# Patient Record
Sex: Male | Born: 1955 | Race: Black or African American | Hispanic: No | Marital: Married | Smoking: Former smoker
Health system: Southern US, Community
[De-identification: ages and names within clinical notes are randomized; demographics above are authoritative.]

## PROBLEM LIST (undated history)

## (undated) DIAGNOSIS — K5790 Diverticulosis of intestine, part unspecified, without perforation or abscess without bleeding: Secondary | ICD-10-CM

## (undated) DIAGNOSIS — I10 Essential (primary) hypertension: Secondary | ICD-10-CM

## (undated) DIAGNOSIS — M1711 Unilateral primary osteoarthritis, right knee: Secondary | ICD-10-CM

## (undated) DIAGNOSIS — F419 Anxiety disorder, unspecified: Secondary | ICD-10-CM

## (undated) DIAGNOSIS — K219 Gastro-esophageal reflux disease without esophagitis: Secondary | ICD-10-CM

## (undated) DIAGNOSIS — K922 Gastrointestinal hemorrhage, unspecified: Secondary | ICD-10-CM

## (undated) DIAGNOSIS — M1990 Unspecified osteoarthritis, unspecified site: Secondary | ICD-10-CM

## (undated) HISTORY — PX: OLECRANON BURSA EXCISION: SUR541

---

## 2005-08-24 ENCOUNTER — Ambulatory Visit: Payer: Self-pay | Admitting: Internal Medicine

## 2005-09-23 ENCOUNTER — Ambulatory Visit: Payer: Self-pay | Admitting: Specialist

## 2006-02-20 ENCOUNTER — Ambulatory Visit: Payer: Self-pay | Admitting: General Surgery

## 2006-05-05 ENCOUNTER — Ambulatory Visit: Payer: Self-pay | Admitting: General Surgery

## 2006-09-29 ENCOUNTER — Ambulatory Visit: Payer: Self-pay | Admitting: Unknown Physician Specialty

## 2006-11-27 ENCOUNTER — Ambulatory Visit: Payer: Self-pay | Admitting: General Surgery

## 2006-12-01 ENCOUNTER — Ambulatory Visit: Payer: Self-pay | Admitting: General Surgery

## 2012-08-02 ENCOUNTER — Ambulatory Visit: Payer: Self-pay | Admitting: Specialist

## 2012-08-14 ENCOUNTER — Ambulatory Visit: Payer: Self-pay | Admitting: Specialist

## 2012-08-15 LAB — PATHOLOGY REPORT

## 2014-02-14 LAB — COMPREHENSIVE METABOLIC PANEL
ANION GAP: 8 (ref 7–16)
Albumin: 3.7 g/dL (ref 3.4–5.0)
Alkaline Phosphatase: 66 U/L
BUN: 27 mg/dL — AB (ref 7–18)
Bilirubin,Total: 0.5 mg/dL (ref 0.2–1.0)
CO2: 23 mmol/L (ref 21–32)
Calcium, Total: 9.2 mg/dL (ref 8.5–10.1)
Chloride: 110 mmol/L — ABNORMAL HIGH (ref 98–107)
Creatinine: 1.44 mg/dL — ABNORMAL HIGH (ref 0.60–1.30)
EGFR (African American): >60
GFR CALC NON AF AMER: 54 — AB
Glucose: 136 mg/dL — ABNORMAL HIGH (ref 65–99)
OSMOLALITY: 288 (ref 275–301)
Potassium: 4.1 mmol/L (ref 3.5–5.1)
SGOT(AST): 18 U/L (ref 15–37)
SGPT (ALT): 25 U/L
SODIUM: 141 mmol/L (ref 136–145)
Total Protein: 7.4 g/dL (ref 6.4–8.2)

## 2014-02-14 LAB — CBC
HCT: 41.2 % (ref 40.0–52.0)
HGB: 13.9 g/dL (ref 13.0–18.0)
MCH: 30.1 pg (ref 26.0–34.0)
MCHC: 33.8 g/dL (ref 32.0–36.0)
MCV: 89 fL (ref 80–100)
PLATELETS: 178 10*3/uL (ref 150–440)
RBC: 4.61 10*6/uL (ref 4.40–5.90)
RDW: 13.4 % (ref 11.5–14.5)
WBC: 6.9 10*3/uL (ref 3.8–10.6)

## 2014-02-15 ENCOUNTER — Inpatient Hospital Stay: Payer: Self-pay | Admitting: Internal Medicine

## 2014-02-15 LAB — HEMOGLOBIN
HGB: 11.1 g/dL — ABNORMAL LOW (ref 13.0–18.0)
HGB: 11.2 g/dL — ABNORMAL LOW (ref 13.0–18.0)

## 2014-02-15 LAB — PROTIME-INR
INR: 1
Prothrombin Time: 12.8 secs (ref 11.5–14.7)

## 2014-02-16 LAB — BASIC METABOLIC PANEL
ANION GAP: 5 — AB (ref 7–16)
BUN: 12 mg/dL (ref 7–18)
CHLORIDE: 109 mmol/L — AB (ref 98–107)
CREATININE: 1.25 mg/dL (ref 0.60–1.30)
Calcium, Total: 8.8 mg/dL (ref 8.5–10.1)
Co2: 25 mmol/L (ref 21–32)
EGFR (African American): >60
EGFR (Non-African Amer.): >60
Glucose: 101 mg/dL — ABNORMAL HIGH (ref 65–99)
Osmolality: 277 (ref 275–301)
Potassium: 3.6 mmol/L (ref 3.5–5.1)
SODIUM: 139 mmol/L (ref 136–145)

## 2014-02-16 LAB — CBC WITH DIFFERENTIAL/PLATELET
Basophil #: 0 10*3/uL (ref 0.0–0.1)
Basophil %: 0.4 %
Eosinophil #: 0.2 10*3/uL (ref 0.0–0.7)
Eosinophil %: 3.2 %
HCT: 34.8 % — AB (ref 40.0–52.0)
HGB: 11.4 g/dL — ABNORMAL LOW (ref 13.0–18.0)
Lymphocyte #: 1 10*3/uL (ref 1.0–3.6)
Lymphocyte %: 19.4 %
MCH: 29.4 pg (ref 26.0–34.0)
MCHC: 32.8 g/dL (ref 32.0–36.0)
MCV: 90 fL (ref 80–100)
Monocyte #: 0.8 x10 3/mm (ref 0.2–1.0)
Monocyte %: 14.2 %
NEUTROS ABS: 3.3 10*3/uL (ref 1.4–6.5)
Neutrophil %: 62.8 %
Platelet: 148 10*3/uL — ABNORMAL LOW (ref 150–440)
RBC: 3.87 10*6/uL — AB (ref 4.40–5.90)
RDW: 13.2 % (ref 11.5–14.5)
WBC: 5.3 10*3/uL (ref 3.8–10.6)

## 2014-02-16 LAB — MAGNESIUM: Magnesium: 1.6 mg/dL — ABNORMAL LOW

## 2014-07-12 ENCOUNTER — Ambulatory Visit: Payer: Self-pay | Admitting: Specialist

## 2014-09-26 NOTE — Op Note (Signed)
PATIENT NAME:  Darrell BignessGARRISON, Darrell Freeman MR#:  [id number] DATE OF BIRTH:  August 23, 1955  DATE OF PROCEDURE:  08/14/2012   PREOPERATIVE DIAGNOSIS: Severe olecranon bursitis, right elbow, with gout.   POSTOPERATIVE DIAGNOSIS: Severe olecranon bursitis, right elbow, with gout.   PROCEDURE: Excision of olecranon bursa, right elbow.   SURGEON: Myra Rudehristopher Smith, M.D.   ANESTHESIA: General.   COMPLICATIONS: None.   TOURNIQUET TIME: Approximately 60 minutes.   DRAIN: One large vessel loop.   DESCRIPTION OF PROCEDURE: One gram of Ancef was given intravenously prior to the procedure. General anesthesia is induced. The right upper extremity is thoroughly prepped with alcohol and ChloraPrep and draped in standard sterile fashion. The extremity is wrapped out with the Esmarch bandage and pneumatic tourniquet elevated to 250 mmHg. A lateral incision is made at the base of the prominence of the bursa and the dissection carefully carried down under loupe magnification to the bursa sac. The bursa sac is then carefully removed under loupe magnification. There is seen to be a moderate amount of gouty crystals present within the sac, indicative of systemic gout. Rongeur is used to clean out any extra remaining pieces of bursal tissue. The wound is thoroughly irrigated multiple times. Skin edges are infiltrated with 0.5% plain Marcaine. The subcutaneous tissue is closed with 3-0 Vicryl, and the skin is closed with the skin stapler. A soft bulky dressing is applied. The patient is returned to the recovery room having tolerated the procedure quite well.   ____________________________ Clare Gandyhristopher E. Smith, MD ces:lo D: 08/14/2012 11:24:39 ET T: 08/14/2012 12:27:18 ET JOB#: [id number]  cc: Clare Gandyhristopher E. Smith, MD, <Dictator> Clare GandyHRISTOPHER E SMITH MD ELECTRONICALLY SIGNED 08/18/2012 13:36

## 2014-09-27 NOTE — Consult Note (Signed)
Chief Complaint:  Subjective/Chief Complaint Cross cover for Dr. Candace Cruise. Patient with last blood in stool was yesterday morning. Says he is feeling well. Also reports that he feels like he may have to have a bowel movement soon. No pain. Colonoscopy last July with tics.   VITAL SIGNS/ANCILLARY NOTES: **Vital Signs.:   13-Sep-15 07:14  Vital Signs Type Q 8hr  Temperature Temperature (F) 97.4  Celsius 36.3  Temperature Source oral  Pulse Pulse 71  Respirations Respirations 17  Systolic BP Systolic BP 340  Diastolic BP (mmHg) Diastolic BP (mmHg) 86  Mean BP 109  Pulse Ox % Pulse Ox % 100  Pulse Ox Activity Level  With exertion  Oxygen Delivery Room Air/ 21 %   Brief Assessment:  GEN well developed, well nourished, no acute distress   Cardiac Regular   Respiratory normal resp effort  no use of accessory muscles   Additional Physical Exam Alert and orientated times 3   Lab Results: Routine Chem:  13-Sep-15 05:22   Glucose, Serum  101  BUN 12  Creatinine (comp) 1.25  Sodium, Serum 139  Potassium, Serum 3.6  Chloride, Serum  109  CO2, Serum 25  Calcium (Total), Serum 8.8  Anion Gap  5  Osmolality (calc) 277  eGFR (African American) >60  eGFR (Non-African American) >60 (eGFR values <75m/min/1.73 m2 may be an indication of chronic kidney disease (CKD). Calculated eGFR is useful in patients with stable renal function. The eGFR calculation will not be reliable in acutely ill patients when serum creatinine is changing rapidly. It is not useful in  patients on dialysis. The eGFR calculation may not be applicable to patients at the low and high extremes of body sizes, pregnant women, and vegetarians.)  Magnesium, Serum  1.6 (1.8-2.4 THERAPEUTIC RANGE: 4-7 mg/dL TOXIC: > 10 mg/dL  -----------------------)  Routine Hem:  13-Sep-15 05:22   WBC (CBC) 5.3  RBC (CBC)  3.87  Hemoglobin (CBC)  11.4  Hematocrit (CBC)  34.8  Platelet Count (CBC)  148  MCV 90  MCH 29.4  MCHC 32.8   RDW 13.2  Neutrophil % 62.8  Lymphocyte % 19.4  Monocyte % 14.2  Eosinophil % 3.2  Basophil % 0.4  Neutrophil # 3.3  Lymphocyte # 1.0  Monocyte # 0.8  Eosinophil # 0.2  Basophil # 0.0 (Result(s) reported on 16 Feb 2014 at 05:41AM.)   Assessment/Plan:  Assessment/Plan:  Assessment Lower Gi bleed.   Plan The patient has been doing well without any bleeding since yesterday morning. If no further bleeding have patient follow up as an outpatient [facility].   Electronic Signatures: WLucilla Lame(MD)  (Signed 13-Sep-15 10:42)  Authored: Chief Complaint, VITAL SIGNS/ANCILLARY NOTES, Brief Assessment, Lab Results, Assessment/Plan   Last Updated: 13-Sep-15 10:42 by WLucilla Lame(MD)

## 2014-09-27 NOTE — Consult Note (Signed)
Pt seen and examined. Full consult to follow. Known hx of polyps. Had a small polyp removed by Dr. Mechele CollinElliott 2 months ago at [facility]. Had been constipated recently. Was taking some hard raisins 2 nights ago. Also, admitted to taking advil multiple times over 3 days or so earlier this week for arthritic knee pain. Yesterday afternoon, started passing BRBPR without any abdominal pain. Came to ER last evening. Had about 6 episodes of rectal bleeding last night. None so far this AM. Otherwise, no other sxs. No CP/SOB/ weakeness/lightheadedness.  CT showed diffuse tics, though more prominent in descending/sigmoid area. Pt likely has diverticular bleed, which hopefully will resolve on own off NSAIDS. If bleeding recurs as diet is advanced, then order bleeding scan to localize site of bleeding. I will be out tomorrow. Dr. Servando SnareWohl will cover for me tomorrow. Thanks.  Electronic Signatures: Lutricia Feilh, Adil Tugwell (MD)  (Signed on 12-Sep-15 10:20)  Authored  Last Updated: 12-Sep-15 10:20 by Lutricia Feilh, Proctor Carriker (MD)

## 2014-09-27 NOTE — Consult Note (Signed)
PATIENT NAME:  Darrell Freeman, Darrell Freeman MR#:  [id number] DATE OF BIRTH:  [birth year]  DATE OF CONSULTATION:  02/15/2014  CONSULTING PHYSICIAN:  Ezzard StandingPaul Y. Bluford Kaufmannh, MD   REASON FOR REFERRAL: Gross hematochezia.   DESCRIPTION: The patient is a 59 year old white male with a known history of diverticulosis and hypertension who presents with bright red blood per rectum that started several hours prior to his visit to the Emergency Room.  Before the hospital visit, he had at least 3 bloody stools. Then in the Emergency Room, he had another bout of hematochezia.  He had a colonoscopy by Dr. Mechele CollinElliott 2 months ago for a history of polyps.  He had a single small polyp that was benign. [facility]. Therefore, I do not have the results to review.   The patient admitted to being constipated recently.  He was taking some hard raisins 2 nights ago.  He also admitted to taking some Advil multiple times over 3 days or so earlier this week because of his arthritic knee pain.  It was yesterday afternoon when he started passing bright blood.  He had no abdominal pain or cramping.  He had a total of 6 episodes by the time I saw him.  Fortunately, he denied having any chest pain or shortness of breath or coughing or palpitations.  He did not feel weak or lightheaded.  He did have a CT scan of the abdomen that showed diffuse diverticula.  It was more prominent in the descending and sigmoid colon area.     PAST MEDICAL HISTORY: Notable for diverticulitis, hypertension.   PAST SURGICAL HISTORY:  He had hernia surgery.   FAMILY HISTORY: Notable for rectal cancer.   HOME MEDICATIONS:  He has home medications for hypertension and gout, but he could not remember the names.   REVIEW OF SYSTEMS: Please refer to the review of symptoms that was dictated by the admitting doctor.   PHYSICAL EXAMINATION:  GENERAL: The patient looks comfortable in no acute distress.  VITAL SIGNS: He is afebrile. Vital signs are stable.   HEENT: Normocephalic, atraumatic head.  Pupils are equally reactive. Throat was clear.  NECK: Supple.  HEART:  Regular rhythm and rate.  LUNGS: Clear bilaterally.  ABDOMEN: Normoactive bowel sounds, soft, nontender. There is no hepatomegaly.  He has active bowel sounds.  EXTREMITIES: No clubbing, cyanosis, or edema.   RADIOLOGICAL DATA AND LABORATORY DATA:  This morning hemoglobin was 11.1, and was 13.9 on admission, creatinine 1.44, chloride 110, BUN is 27, creatinine 1.44.  Liver enzymes were normal.  INR was normal.   ASSESSMENT AND PLAN: This is a patient with known history of diverticulosis. He does have a history of polyps.  It has been 2 months since his colonoscopy.  Therefore, it will be highly unlikely the bleeding came from the polypectomy.  It is likely with his extensive diverticulitis that he bled from the diverticulosis.  It may be exacerbated by recent non-steroidal antiinflammatory drugs use.  I suspect the bleeding will stop on its own of the non-steroidal antiinflammatory drugs.  However, if the bleeding recurs, we can order a bleeding scan to localize the site of bleeding.  We will start him on a clear liquid diet and then gradually advance as tolerated.   I will have Dr. Servando SnareWohl see the patient tomorrow in my absence.   Thank you for the referral.     ____________________________ Ezzard StandingPaul Y. Bluford Kaufmannh, MD pyo:DT D: 02/17/2014 17:05:00 ET T: 02/17/2014 17:35:17 ET  JOB#: [id number]  cc: Ezzard Standing. Bluford Kaufmann, MD, <Dictator> Ezzard Standing Lyndia Bury MD ELECTRONICALLY SIGNED 02/18/2014 9:32

## 2014-09-27 NOTE — Discharge Summary (Signed)
PATIENT NAME:  Darrell Freeman, Darrell Freeman MR#:  [id number] DATE OF BIRTH:  Jan 24, 1956  DATE OF ADMISSION:  02/15/2014 DATE OF DISCHARGE:  02/16/2014  For a detailed note please look at the history and physical done on admission by Dr. Randol KernElgergawy.    DIAGNOSES AT DISCHARGE:  Gastrointestinal bleed, likely diverticular in nature, hypertension. Acute renal failure.   DISCHARGE INSTRUCTIONS: The patient is being discharged on a low-sodium diet. Activity is as tolerated. Follow up with Dr. Lutricia FeilPaul Oh and Dr. Clydie Braunavid Fitzgerald in the next 1-2 weeks.   DISCHARGE MEDICATIONS:  Losartan 50 mg daily, lisinopril 10 mg daily, prednisone taper, Robitussin as needed, Flonase 2 sprays to each nostril daily as needed.   CONSULTANTS DURING THE HOSPITAL COURSE: Dr. Midge Miniumarren Wohl and Dr. Lutricia FeilPaul Oh from gastroenterology.   PERTINENT STUDIES DONE DURING THE HOSPITAL COURSE:  CT scan of the abdomen and pelvis done with contrast showing scattered diverticulosis along the entirety of the colon, most prominent in descending and proximal sigmoid colon without evidence of diverticulitis.   HOSPITAL COURSE: This is a 59 year old male with medical problems as mentioned above, who presented to the hospital with multiple episodes of bright red blood per rectum.   1. GI bleed. This was likely a lower GI bleed suspected to be diverticular in nature given the CT scan findings. The patient apparently was taking increasing doses of ibuprofen at home for some joint pains. The patient was observed being off the nonsteroidal anti-inflammatory drugs and his bleeding had improved. He did continue to have some maroon-colored stool which was old blood, but no evidence of acute bright red blood. The patient was seen by GI,  they thought this was a diverticular bleed and  it would be self-limiting. They do not plan on doing any endoscopic evaluation at this point. He was strongly advised to avoid nonsteroidal anti-inflammatory drugs. The patient's diet was  slowly advanced from a liquid eventually to a  low residue diet which he tolerated without evidence of any acute bleeding and his hemoglobin remained stable. The patient therefore is currently being discharged with close followup with GI as an outpatient.  2. Hypertension. The patient was somewhat hypotensive when he presented, therefore his antihypertensives were held, although he can resume his losartan and lisinopril upon discharge.  3. Acute renal failure. This was likely prerenal azotemia from the acute volume loss from the GI bleed. The patient was hydrated with IV fluids and his BUN creatinine have now come back to baseline.   CODE STATUS: The patient is a full code.   DISPOSITION: He is being discharged home.   TIME SPENT ON DISCHARGE: 40 minutes    ____________________________ Rolly PancakeVivek J. Cherlynn KaiserSainani, MD vjs:bu D: 02/17/2014 15:37:52 ET T: 02/17/2014 15:54:30 ET JOB#: [id number]  cc: Rolly PancakeVivek J. Cherlynn KaiserSainani, MD, <Dictator> Ezzard StandingPaul Y. Bluford Kaufmannh, MD Stann Mainlandavid P. Sampson GoonFitzgerald, MD Houston SirenVIVEK J Giovanne Nickolson MD ELECTRONICALLY SIGNED 02/24/2014 9:42

## 2014-09-27 NOTE — H&P (Signed)
PATIENT NAME:  Darrell Freeman, Darrell Freeman MR#:  [id number] DATE OF BIRTH:  06-26-1955  DATE OF ADMISSION:  02/15/2014  REFERRING PHYSICIAN: Northlake Sink. Dolores Frame, MD  PRIMARY CARE PHYSICIAN: Stann Mainland. Sampson Goon, MD  CHIEF COMPLAINT: Bright red blood per rectum.   HISTORY OF PRESENT ILLNESS: This is a 59 year old male with known history of hypertension, diverticulitis and diverticulosis, who presents with complaints of bright red blood per rectum that started a few hours ago. He reports so far he had 3 large bowel movements. As well, the patient had 1 more episode in the ED where he was noticed to have hematochezia. The patient reports he had recent colonoscopy by Dr. Mechele Collin 2 months ago, where he was found to have a polyp which was benign; besides that, he reports no other abnormalities. The patient's hemoglobin was stable at 13.9. He denies any NSAID use, any coffee-ground emesis, any melena, denies any history of gastric ulcer or previously bleed in the past. The patient had CT abdomen and pelvis done, which did show evidence of diverticulosis. The hospitalist service requested to admit the patient for further evaluation.   He denies any chest pain, any shortness of breath, fever, chills, cough, productive sputum. Reports he had recent upper respiratory symptoms, where he was prescribed p.o. prednisone.    PAST MEDICAL HISTORY:  1.  Hypertension.  2.  Diverticulosis.  3.  Gout.   PAST SURGICAL HISTORY:  1.  Colonoscopy.  2.  Hernia surgery.   SOCIAL HISTORY: The patient works at [employer]. No smoking. No alcohol. No illicit drug use.   FAMILY HISTORY: Reports family history of rectal cancer.   ALLERGIES: No known drug allergies.   HOME MEDICATIONS: The patient reports he is taking gout medication as needed; does not remember the name. As well, reports he is antihypertensive medication, which he cannot recall the name. He reports he was recently started on tapering dose of prednisone for upper respiratory  symptoms.   REVIEW OF SYSTEMS:  CONSTITUTIONAL: Denies fever, chills, fatigue, weakness.  EYES: Denies blurry vision, double vision, inflammation.  ENT: Denies tinnitus, ear pain, hearing loss, epistaxis.  RESPIRATORY: Denies cough, wheezing, shortness of breath or COPD.  CARDIOVASCULAR: Denies chest pain, edema, palpitation, syncope.  GASTROINTESTINAL: Denies nausea, vomiting, diarrhea, abdominal pain, hematemesis, melena. Reports bright red blood per rectum.  GENITOURINARY: Denies dysuria, hematuria, renal colic.  ENDOCRINE: Denies polyuria, polydipsia, heat or cold intolerance. HEMATOLOGIC: Denies anemia, easy bruising, bleeding diathesis.  INTEGUMENTARY: Denies acne, rash, or skin lesion.  MUSCULOSKELETAL: Denies any swelling, arthritis, cramps.  NEUROLOGIC: Denies CVA, TIA, tremors, vertigo, ataxia.  PSYCHIATRIC: Denies anxiety, insomnia, or depression.   PHYSICAL EXAMINATION:  VITAL SIGNS: Temperature 98, pulse 71, respiratory rate 18, blood pressure 135/92, saturating 100% on room air.  GENERAL: Well-nourished male who looks comfortable in bed, in no apparent distress.  HEENT: Head atraumatic, normocephalic.  Pupils equal and reactive to light. Pink conjunctivae. Anicteric sclerae. Moist oral mucosa.  NECK: Supple. No thyromegaly. No JVD.  CHEST: Good air entry bilaterally. No wheezing, rales or rhonchi. No use of accessory muscles.  CARDIOVASCULAR: S1, S2 heard. No rubs, murmur or gallops. PMI nondisplaced.  ABDOMEN: Soft, nontender, nondistended. Bowel sounds present. No rebound. No guarding.  EXTREMITIES: No edema. No clubbing. No cyanosis. Pedal pulses +2 bilaterally.  PSYCHIATRIC: Appropriate affect x 3.  NEUROLOGIC: Cranial nerves grossly intact. Motor 5/5. Sensation symmetrical and intact to light touch.  MUSCULOSKELETAL: No joint effusion or erythema.  SKIN: Normal skin turgor. Warm and dry.  PERTINENT LABORATORY DATA: Glucose 136, BUN 27, creatinine 1.44, sodium 141,  potassium 4.1, chloride 110. White blood cells 6.9, hemoglobin 13.9, hematocrit 41.2, platelets 178,000.   ASSESSMENT AND PLAN:  1.  Hematochezia/bright red blood per rectum. This is most likely lower GI bleed from diverticulosis. The patient will be admitted for further evaluation. We will consult t gastroenterology service. We will keep him on a clear liquid diet .We will check hemoglobin every 8 hours and transfuse as needed.  2.  Hypertension. Blood pressure is acceptable. We will resume back on his home medication when it is known to us. 3.  Deep vein thrombosis prophylaxis. Sequential compression device and TED hose. No chemical anticoagulation secondary to his bleed.   TOTAL TIME SPENT ON ADMISSION AND PATIENT CARE: 50 minutes    ____________________________ Darrell Armsawood S. Elgergawy, MD dse:MT D: 02/15/2014 05:15:06 ET T: 02/15/2014 06:47:33 ET JOB#: [id number]  cc: Darrell Armsawood S. Elgergawy, MD, <Dictator>  DAWOOD Teena IraniS ELGERGAWY MD ELECTRONICALLY SIGNED 02/15/2014 23:50

## 2014-12-12 ENCOUNTER — Other Ambulatory Visit: Payer: Self-pay

## 2014-12-12 ENCOUNTER — Encounter: Payer: Self-pay | Admitting: *Deleted

## 2014-12-12 NOTE — Patient Instructions (Signed)
  Your procedure is scheduled on: 12-26-14 Report to [facility] 2ND FLOOR To find out your arrival time please call [phone] between 1PM - 3PM on 12-25-14  Remember: Instructions that are not followed completely may result in serious medical risk, up to and including death, or upon the discretion of your surgeon and anesthesiologist your surgery may need to be rescheduled.    _X___ 1. Do not eat food or drink liquids after midnight. No gum chewing or hard candies.     _X___ 2. No Alcohol for 24 hours before or after surgery.   ____ 3. Bring all medications with you on the day of surgery if instructed.    ____ 4. Notify your doctor if there is any change in your medical condition     (cold, fever, infections).     Do not wear jewelry, make-up, hairpins, clips or nail polish.  Do not wear lotions, powders, or perfumes. You may wear deodorant.  Do not shave 48 hours prior to surgery. Men may shave face and neck.  Do not bring valuables to the hospital.    [facility] any belongings or valuables.               Contacts, dentures or bridgework may not be worn into surgery.  Leave your suitcase in the car. After surgery it may be brought to your room.  For patients admitted to the hospital, discharge time is determined by your  treatment team.   Patients discharged the day of surgery will not be allowed to drive home.   Please read over the following fact sheets that you were given:     __X__ Take these medicines the morning of surgery with A SIP OF WATER:    1. LOSARTAN  2. ZANTAC  3. TAKE A ZANTAC Thursday NIGHT  4.  5.  6.  ____ Fleet Enema (as directed)   ____ Use CHG Soap as directed  ____ Use inhalers on the day of surgery  ____ Stop metformin 2 days prior to surgery    ____ Take 1/2 of usual insulin dose the night before surgery and none on the morning of surgery.   ____ Stop Coumadin/Plavix/aspirin-N/A  __X__ Stop  Anti-inflammatories-STOP ADVIL 7 DAYS PRIOR-NO NSAIDS OR ASA PRODUCTS-TYLENOL OK   ____ Stop supplements until after surgery.    ____ Bring C-Pap to the hospital.

## 2014-12-26 ENCOUNTER — Ambulatory Visit
Admission: RE | Admit: 2014-12-26 | Discharge: 2014-12-26 | Disposition: A | Discharge status: Home / Self Care | Payer: 59 | Source: Ambulatory Visit | Attending: Specialist | Admitting: Specialist

## 2014-12-26 ENCOUNTER — Ambulatory Visit: Payer: 59 | Admitting: Anesthesiology

## 2014-12-26 ENCOUNTER — Encounter
Admission: RE | Disposition: A | Discharge status: Home / Self Care | Payer: Self-pay | Source: Ambulatory Visit | Attending: Specialist

## 2014-12-26 ENCOUNTER — Encounter: Payer: Self-pay | Admitting: *Deleted

## 2014-12-26 DIAGNOSIS — M1990 Unspecified osteoarthritis, unspecified site: Secondary | ICD-10-CM | POA: Insufficient documentation

## 2014-12-26 DIAGNOSIS — M1A0220 Idiopathic chronic gout, left elbow, without tophus (tophi): Secondary | ICD-10-CM | POA: Diagnosis not present

## 2014-12-26 DIAGNOSIS — K219 Gastro-esophageal reflux disease without esophagitis: Secondary | ICD-10-CM | POA: Diagnosis not present

## 2014-12-26 DIAGNOSIS — I10 Essential (primary) hypertension: Secondary | ICD-10-CM | POA: Diagnosis not present

## 2014-12-26 DIAGNOSIS — Z87891 Personal history of nicotine dependence: Secondary | ICD-10-CM | POA: Insufficient documentation

## 2014-12-26 DIAGNOSIS — M7032 Other bursitis of elbow, left elbow: Secondary | ICD-10-CM | POA: Diagnosis present

## 2014-12-26 HISTORY — DX: Unspecified osteoarthritis, unspecified site: M19.90

## 2014-12-26 HISTORY — DX: Essential (primary) hypertension: I10

## 2014-12-26 HISTORY — PX: OLECRANON BURSECTOMY: SHX2097

## 2014-12-26 HISTORY — DX: Gastro-esophageal reflux disease without esophagitis: K21.9

## 2014-12-26 SURGERY — BURSECTOMY, ELBOW
Anesthesia: General | Laterality: Left

## 2014-12-26 MED ORDER — OXYCODONE HCL 5 MG PO TABS
ORAL_TABLET | ORAL | Status: DC
Start: 2014-12-26 — End: 2014-12-26
  Filled 2014-12-26: qty 1

## 2014-12-26 MED ORDER — LACTATED RINGERS IV SOLN
INTRAVENOUS | Status: DC | PRN
  Administered 2014-12-26: 07:00:00 via INTRAVENOUS

## 2014-12-26 MED ORDER — FENTANYL CITRATE (PF) 100 MCG/2ML IJ SOLN
INTRAMUSCULAR | Status: DC | PRN
  Administered 2014-12-26: 50 ug via INTRAVENOUS

## 2014-12-26 MED ORDER — LIDOCAINE HCL (PF) 1 % IJ SOLN
INTRAMUSCULAR | Status: AC
  Filled 2014-12-26: qty 30

## 2014-12-26 MED ORDER — HYDROCODONE-ACETAMINOPHEN 5-325 MG PO TABS
ORAL_TABLET | ORAL | Status: AC
  Administered 2014-12-26: 1 via ORAL
  Filled 2014-12-26: qty 1

## 2014-12-26 MED ORDER — PROPOFOL 10 MG/ML IV BOLUS
INTRAVENOUS | Status: DC | PRN
  Administered 2014-12-26: 200 mg via INTRAVENOUS

## 2014-12-26 MED ORDER — CEFAZOLIN SODIUM-DEXTROSE 2-3 GM-% IV SOLR
INTRAVENOUS | Status: DC | PRN
  Administered 2014-12-26: 2 g via INTRAVENOUS

## 2014-12-26 MED ORDER — HYDROCODONE-ACETAMINOPHEN 5-325 MG PO TABS
1.0000 | ORAL_TABLET | Freq: Once | ORAL | Status: AC
  Administered 2014-12-26: 1 via ORAL

## 2014-12-26 MED ORDER — DEXAMETHASONE SODIUM PHOSPHATE 10 MG/ML IJ SOLN
INTRAMUSCULAR | Status: DC | PRN
  Administered 2014-12-26: 10 mg via INTRAVENOUS

## 2014-12-26 MED ORDER — BUPIVACAINE HCL 0.5 % IJ SOLN
INTRAMUSCULAR | Status: DC | PRN
Start: 2014-12-26 — End: 2014-12-26
  Administered 2014-12-26: 10 mL

## 2014-12-26 MED ORDER — OXYCODONE HCL 5 MG/5ML PO SOLN: 5.0000 mg | Freq: Once | ORAL | Status: AC | PRN

## 2014-12-26 MED ORDER — OXYCODONE HCL 5 MG PO TABS
ORAL_TABLET | ORAL | Status: AC
  Filled 2014-12-26: qty 1

## 2014-12-26 MED ORDER — BUPIVACAINE HCL (PF) 0.5 % IJ SOLN
INTRAMUSCULAR | Status: AC
  Filled 2014-12-26: qty 30

## 2014-12-26 MED ORDER — FENTANYL CITRATE (PF) 100 MCG/2ML IJ SOLN
INTRAMUSCULAR | Status: AC
  Administered 2014-12-26: 25 ug via INTRAVENOUS
  Filled 2014-12-26: qty 2

## 2014-12-26 MED ORDER — HYDROCODONE-ACETAMINOPHEN 5-325 MG PO TABS: 1.0000 | ORAL_TABLET | Freq: Four times a day (QID) | ORAL | Status: DC | PRN

## 2014-12-26 MED ORDER — OXYCODONE HCL 5 MG PO TABS
5.0000 mg | ORAL_TABLET | Freq: Once | ORAL | Status: AC | PRN
  Administered 2014-12-26: 5 mg via ORAL

## 2014-12-26 MED ORDER — EPHEDRINE SULFATE 50 MG/ML IJ SOLN
INTRAMUSCULAR | Status: DC | PRN
  Administered 2014-12-26: 10 mg via INTRAVENOUS

## 2014-12-26 MED ORDER — ACETAMINOPHEN 10 MG/ML IV SOLN
INTRAVENOUS | Status: DC | PRN
  Administered 2014-12-26: 1000 mg via INTRAVENOUS

## 2014-12-26 MED ORDER — MIDAZOLAM HCL 5 MG/5ML IJ SOLN
INTRAMUSCULAR | Status: DC | PRN
  Administered 2014-12-26: 2 mg via INTRAVENOUS

## 2014-12-26 MED ORDER — ONDANSETRON HCL 4 MG/2ML IJ SOLN
INTRAMUSCULAR | Status: DC | PRN
  Administered 2014-12-26: 4 mg via INTRAVENOUS

## 2014-12-26 MED ORDER — LIDOCAINE HCL (CARDIAC) 20 MG/ML IV SOLN
INTRAVENOUS | Status: DC | PRN
  Administered 2014-12-26: 100 mg via INTRAVENOUS

## 2014-12-26 MED ORDER — FENTANYL CITRATE (PF) 100 MCG/2ML IJ SOLN
25.0000 ug | INTRAMUSCULAR | Status: DC | PRN
  Administered 2014-12-26 (×2): 25 ug via INTRAVENOUS

## 2014-12-26 SURGICAL SUPPLY — 32 items
BLADE SURG SZ10 CARB STEEL (BLADE) ×3 IMPLANT
BNDG COHESIVE 4X5 TAN STRL (GAUZE/BANDAGES/DRESSINGS) ×3 IMPLANT
BNDG ESMARK 4X12 TAN STRL LF (GAUZE/BANDAGES/DRESSINGS) ×3 IMPLANT
CANISTER SUCT 1200ML W/VALVE (MISCELLANEOUS) ×3 IMPLANT
CHLORAPREP W/TINT 26ML (MISCELLANEOUS) ×3 IMPLANT
GLOVE BIO SURGEON STRL SZ8 (GLOVE) ×3 IMPLANT
GLOVE SURG ORTHO 8.5 STRL (GLOVE) ×3 IMPLANT
GOWN STRL REUS W/ TWL LRG LVL3 (GOWN DISPOSABLE) ×2 IMPLANT
GOWN STRL REUS W/TWL LRG LVL3 (GOWN DISPOSABLE) ×4
KIT RM TURNOVER STRD PROC AR (KITS) ×3 IMPLANT
LABEL OR SOLS (LABEL) ×3 IMPLANT
LOOP VESSEL SUPERMAXI WHITE (MISCELLANEOUS) ×6 IMPLANT
NDL SAFETY 18GX1.5 (NEEDLE) ×3 IMPLANT
NS IRRIG 1000ML POUR BTL (IV SOLUTION) ×3 IMPLANT
PACK EXTREMITY ARMC (MISCELLANEOUS) ×3 IMPLANT
PAD CAST CTTN 4X4 STRL (SOFTGOODS) ×1 IMPLANT
PAD GROUND ADULT SPLIT (MISCELLANEOUS) ×3 IMPLANT
PADDING CAST COTTON 4X4 STRL (SOFTGOODS) ×2
SLING ARM LRG DEEP (SOFTGOODS) ×3 IMPLANT
SPLINT CAST 1 STEP 3X12 (MISCELLANEOUS) IMPLANT
SPONGE LAP 18X18 5 PK (GAUZE/BANDAGES/DRESSINGS) ×3 IMPLANT
STAPLER SKIN PROX 35W (STAPLE) ×3 IMPLANT
STOCKINETTE BIAS CUT 4 980044 (GAUZE/BANDAGES/DRESSINGS) IMPLANT
STOCKINETTE IMPERVIOUS 9X36 MD (GAUZE/BANDAGES/DRESSINGS) ×3 IMPLANT
SUT ETHILON 4-0 (SUTURE)
SUT ETHILON 4-0 FS2 18XMFL BLK (SUTURE)
SUT VIC AB 2-0 CT1 36 (SUTURE) ×3 IMPLANT
SUT VIC AB 4-0 SH 27 (SUTURE)
SUT VIC AB 4-0 SH 27XANBCTRL (SUTURE) IMPLANT
SUT VICRYL+ 3-0 36IN CT-1 (SUTURE) ×3 IMPLANT
SUTURE ETHLN 4-0 FS2 18XMF BLK (SUTURE) IMPLANT
SYRINGE 10CC LL (SYRINGE) ×3 IMPLANT

## 2014-12-26 NOTE — Op Note (Signed)
NAMEALERIC, FROELICH NO.:  [id number]  MEDICAL RECORD NO.:  [id number]  LOCATION:  [facility]                         FACILITY:  [facility]  PHYSICIAN:  Reita Chard, MD        DATE OF BIRTH:  [birth year]  DATE OF PROCEDURE:  12/26/2014 DATE OF DISCHARGE:  12/26/2014                              OPERATIVE REPORT   PREOPERATIVE DIAGNOSIS:  Chronic olecranon bursitis, left elbow.  POSTOPERATIVE DIAGNOSIS:  Chronic olecranon bursitis, left elbow.  PROCEDURE:  Excision of olecranon bursa, left elbow.  SURGEON:  Reita Chard, MD.  ANESTHESIA:  General.  COMPLICATIONS:  None.  TOURNIQUET TIME:  40 minutes.  DESCRIPTION OF PROCEDURE:  A 1 g of Ancef was given intravenously prior to the procedure.  General anesthesia was induced.  The left upper extremity was thoroughly prepped with alcohol and ChloraPrep and draped in standard sterile fashion.  The extremity was wrapped out with the Esmarch bandage, and pneumatic tourniquet elevated to 250 mmHg.  Under loupe magnification, standard curved lateral incision is made over the bursa.  The bursa sac is carefully dissected out under loupe magnification.  There are seen to be multiple gout crystals present. The entire olecranon sac is completely dissected out and removed with careful preservation of the ulnar nerve medially.  The rongeur is used to remove any remaining small pieces of bursa.  The wound is thoroughly irrigated multiple times.  Two large vessel loop drains are brought out through a separate stab wound incision.  The skin is closed with the stapler.  Soft bulky dressing in a sling is applied, and the patient is returned to the recovery room in satisfactory condition having tolerated the procedure quite well.          ______________________________ Reita Chard, MD     CS/MEDQ  D:  12/26/2014  T:  12/26/2014  Job:  [id number]

## 2014-12-26 NOTE — Anesthesia Preprocedure Evaluation (Signed)
Anesthesia Evaluation  Patient identified by MRN, date of birth, ID band Patient awake    Reviewed: Allergy & Precautions, H&P , NPO status , Patient's Chart, lab work & pertinent test results, reviewed documented beta blocker date and time   Airway Mallampati: II  TM Distance: >3 FB Neck ROM: full    Dental no notable dental hx. (+) Teeth Intact   Pulmonary former smoker,  breath sounds clear to auscultation  Pulmonary exam normal       Cardiovascular Exercise Tolerance: Good hypertension, Normal cardiovascular examRhythm:regular Rate:Normal     Neuro/Psych negative neurological ROS  negative psych ROS   GI/Hepatic Neg liver ROS, GERD-  Controlled,  Endo/Other  negative endocrine ROS  Renal/GU negative Renal ROS  negative genitourinary   Musculoskeletal  (+) Arthritis -,   Abdominal   Peds  Hematology negative hematology ROS (+)   Anesthesia Other Findings Past Medical History:   Arthritis                                                    Hypertension                                                 GERD (gastroesophageal reflux disease)                       Signs and symptoms suggestive of sleep apnea    Reproductive/Obstetrics negative OB ROS                             Anesthesia Physical Anesthesia Plan  ASA: III  Anesthesia Plan: General LMA   Post-op Pain Management:    Induction:   Airway Management Planned:   Additional Equipment:   Intra-op Plan:   Post-operative Plan:   Informed Consent: I have reviewed the patients History and Physical, chart, labs and discussed the procedure including the risks, benefits and alternatives for the proposed anesthesia with the patient or authorized representative who has indicated his/her understanding and acceptance.   Dental Advisory Given  Plan Discussed with: Anesthesiologist, CRNA and Surgeon  Anesthesia Plan Comments:          Anesthesia Quick Evaluation

## 2014-12-26 NOTE — Brief Op Note (Signed)
12/26/2014  8:44 AM  PATIENT:  Clayborn Bigness  59 y.o. male  PRE-OPERATIVE DIAGNOSIS:  OLECRANON BURSITIS  POST-OPERATIVE DIAGNOSIS:  same  PROCEDURE:  Procedure(s): OLECRANON BURSA (Left)  SURGEON:  Surgeon(s) and Role:    * Myra Rude, MD - Primary  PHYSICIAN ASSISTANT:   ASSISTANTS: none   ANESTHESIA:   general  EBL:  Total I/O In: 600 [I.V.:600] Out: -   BLOOD ADMINISTERED:none  DRAINS: Penrose drain in the left elbow   LOCAL MEDICATIONS USED:  MARCAINE     SPECIMEN:  Source of Specimen:  bursa left elbow  DISPOSITION OF SPECIMEN:  PATHOLOGY  COUNTS:  YES  TOURNIQUET:   Total Tourniquet Time Documented: area (laterality) - 45 minutes Total: area (laterality) - 45 minutes   DICTATION: .Other Dictation: Dictation Number [id number]  PLAN OF CARE: Discharge to home after PACU  PATIENT DISPOSITION:  PACU - hemodynamically stable.   Delay start of Pharmacological VTE agent (>24hrs) due to surgical blood loss or risk of bleeding: not applicable

## 2014-12-26 NOTE — Discharge Instructions (Signed)
Keep left arm dressing clean and dry May remove sling as necessary If some blood shows up on dressing, please just reinforce.AMBULATORY SURGERY  DISCHARGE INSTRUCTIONS   1) The drugs that you were given will stay in your system until tomorrow so for the next 24 hours you should not:  A) Drive an automobile B) Make any legal decisions C) Drink any alcoholic beverage   2) You may resume regular meals tomorrow.  Today it is better to start with liquids and gradually work up to solid foods.  You may eat anything you prefer, but it is better to start with liquids, then soup and crackers, and gradually work up to solid foods.   3) Please notify your doctor immediately if you have any unusual bleeding, trouble breathing, redness and pain at the surgery site, drainage, fever, or pain not relieved by medication.    4) Additional Instructions:        Please contact your physician with any problems or Same Day Surgery at [phone], Monday through Friday 6 am to 4 pm, or [place] at [facility] number at [phone].

## 2014-12-26 NOTE — Anesthesia Postprocedure Evaluation (Signed)
  Anesthesia Post-op Note  Patient: Darrell Freeman  Procedure(s) Performed: Procedure(s): OLECRANON BURSA (Left)  Anesthesia type:General LMA  Patient location: PACU  Post pain: Pain level controlled  Post assessment: Post-op Vital signs reviewed, Patient's Cardiovascular Status Stable, Respiratory Function Stable, Patent Airway and No signs of Nausea or vomiting  Post vital signs: Reviewed and stable  Last Vitals:  Filed Vitals:   12/26/14 1004  BP: 138/91  Pulse: 72  Temp:   Resp: 18    Level of consciousness: awake, alert  and patient cooperative  Complications: No apparent anesthesia complications

## 2014-12-26 NOTE — Anesthesia Procedure Notes (Signed)
Procedure Name: LMA Insertion Date/Time: 12/26/2014 7:20 AM Performed by: Chong Sicilian Pre-anesthesia Checklist: Patient identified, Emergency Drugs available, Suction available, Patient being monitored and Timeout performed Patient Re-evaluated:Patient Re-evaluated prior to inductionOxygen Delivery Method: Circle system utilized and Simple face mask Preoxygenation: Pre-oxygenation with 100% oxygen Intubation Type: IV induction Ventilation: Mask ventilation without difficulty LMA Size: 4.5 Number of attempts: 1 Placement Confirmation: breath sounds checked- equal and bilateral and positive ETCO2 Tube secured with: Tape

## 2014-12-26 NOTE — H&P (Signed)
  59 year old with olecranon bursitis left elbow.  Full History and Physical has been inserted into the chart as a paper document.  Heart and lungs clear.  ENT normal.  Plan: Excision olecranon bursa left elbow.

## 2014-12-26 NOTE — Transfer of Care (Signed)
Immediate Anesthesia Transfer of Care Note  Patient: Darrell Freeman  Procedure(s) Performed: Procedure(s): OLECRANON BURSA (Left)  Patient Location: PACU  Anesthesia Type:General  Level of Consciousness: sedated  Airway & Oxygen Therapy: Patient Spontanous Breathing and Patient connected to face mask oxygen  Post-op Assessment: Report given to RN and Post -op Vital signs reviewed and stable  Post vital signs: Reviewed and stable  Last Vitals:  Filed Vitals:   12/26/14 0839  BP: 119/76  Pulse: 81  Temp: 36.2 C  Resp: 20    Complications: No apparent anesthesia complications

## 2014-12-29 LAB — SURGICAL PATHOLOGY

## 2015-10-27 ENCOUNTER — Encounter: Payer: Self-pay | Admitting: Emergency Medicine

## 2015-10-27 ENCOUNTER — Emergency Department
Admission: EM | Admit: 2015-10-27 | Discharge: 2015-10-27 | Disposition: A | Discharge status: Home / Self Care | Payer: 59 | Attending: Emergency Medicine | Admitting: Emergency Medicine

## 2015-10-27 ENCOUNTER — Emergency Department: Payer: 59

## 2015-10-27 DIAGNOSIS — M1990 Unspecified osteoarthritis, unspecified site: Secondary | ICD-10-CM | POA: Diagnosis not present

## 2015-10-27 DIAGNOSIS — R200 Anesthesia of skin: Secondary | ICD-10-CM | POA: Insufficient documentation

## 2015-10-27 DIAGNOSIS — R42 Dizziness and giddiness: Secondary | ICD-10-CM | POA: Diagnosis present

## 2015-10-27 DIAGNOSIS — Z87891 Personal history of nicotine dependence: Secondary | ICD-10-CM | POA: Insufficient documentation

## 2015-10-27 DIAGNOSIS — R930 Abnormal findings on diagnostic imaging of skull and head, not elsewhere classified: Secondary | ICD-10-CM

## 2015-10-27 DIAGNOSIS — I6501 Occlusion and stenosis of right vertebral artery: Secondary | ICD-10-CM | POA: Insufficient documentation

## 2015-10-27 DIAGNOSIS — I10 Essential (primary) hypertension: Secondary | ICD-10-CM | POA: Diagnosis not present

## 2015-10-27 DIAGNOSIS — H9313 Tinnitus, bilateral: Secondary | ICD-10-CM | POA: Diagnosis not present

## 2015-10-27 DIAGNOSIS — Z79899 Other long term (current) drug therapy: Secondary | ICD-10-CM | POA: Diagnosis not present

## 2015-10-27 LAB — COMPREHENSIVE METABOLIC PANEL
ALK PHOS: 71 U/L (ref 38–126)
ALT: 29 U/L (ref 17–63)
AST: 24 U/L (ref 15–41)
Albumin: 4.3 g/dL (ref 3.5–5.0)
Anion gap: 8 (ref 5–15)
BUN: 25 mg/dL — AB (ref 6–20)
CALCIUM: 9.5 mg/dL (ref 8.9–10.3)
CO2: 22 mmol/L (ref 22–32)
CREATININE: 1.32 mg/dL — AB (ref 0.61–1.24)
Chloride: 108 mmol/L (ref 101–111)
GFR, EST NON AFRICAN AMERICAN: 58 mL/min — AB (ref >60)
Glucose, Bld: 128 mg/dL — ABNORMAL HIGH (ref 65–99)
Potassium: 3.9 mmol/L (ref 3.5–5.1)
SODIUM: 138 mmol/L (ref 135–145)
Total Bilirubin: 0.9 mg/dL (ref 0.3–1.2)
Total Protein: 7.2 g/dL (ref 6.5–8.1)

## 2015-10-27 LAB — PROTIME-INR
INR: 0.91
Prothrombin Time: 12.5 seconds (ref 11.4–15.0)

## 2015-10-27 LAB — CBC
HEMATOCRIT: 42.1 % (ref 40.0–52.0)
HEMOGLOBIN: 14.2 g/dL (ref 13.0–18.0)
MCH: 29 pg (ref 26.0–34.0)
MCHC: 33.7 g/dL (ref 32.0–36.0)
MCV: 86.3 fL (ref 80.0–100.0)
Platelets: 186 10*3/uL (ref 150–440)
RBC: 4.89 MIL/uL (ref 4.40–5.90)
RDW: 13.3 % (ref 11.5–14.5)
WBC: 7.4 10*3/uL (ref 3.8–10.6)

## 2015-10-27 LAB — DIFFERENTIAL
Basophils Absolute: 0 10*3/uL (ref 0–0.1)
Basophils Relative: 1 %
Eosinophils Absolute: 0.4 10*3/uL (ref 0–0.7)
Eosinophils Relative: 5 %
LYMPHS PCT: 23 %
Lymphs Abs: 1.7 10*3/uL (ref 1.0–3.6)
MONO ABS: 1 10*3/uL (ref 0.2–1.0)
MONOS PCT: 14 %
NEUTROS ABS: 4.3 10*3/uL (ref 1.4–6.5)
Neutrophils Relative %: 57 %

## 2015-10-27 LAB — APTT: aPTT: <24 seconds — ABNORMAL LOW (ref 24–36)

## 2015-10-27 LAB — TROPONIN I: Troponin I: <0.03 ng/mL (ref <0.031)

## 2015-10-27 LAB — GLUCOSE, CAPILLARY: GLUCOSE-CAPILLARY: 123 mg/dL — AB (ref 65–99)

## 2015-10-27 MED ORDER — GADOBENATE DIMEGLUMINE 529 MG/ML IV SOLN
20.0000 mL | Freq: Once | INTRAVENOUS | Status: AC | PRN
  Administered 2015-10-27: 17 mL via INTRAVENOUS

## 2015-10-27 MED ORDER — DIAZEPAM 5 MG/ML IJ SOLN
5.0000 mg | Freq: Once | INTRAMUSCULAR | Status: AC
  Administered 2015-10-27: 5 mg via INTRAVENOUS
  Filled 2015-10-27: qty 2

## 2015-10-27 MED ORDER — SODIUM CHLORIDE 0.9 % IV BOLUS (SEPSIS)
1000.0000 mL | Freq: Once | INTRAVENOUS | Status: AC
  Administered 2015-10-27: 1000 mL via INTRAVENOUS

## 2015-10-27 MED ORDER — MECLIZINE HCL 25 MG PO TABS
25.0000 mg | ORAL_TABLET | Freq: Once | ORAL | Status: AC
  Administered 2015-10-27: 25 mg via ORAL
  Filled 2015-10-27: qty 1

## 2015-10-27 MED ORDER — ONDANSETRON HCL 4 MG/2ML IJ SOLN
4.0000 mg | Freq: Once | INTRAMUSCULAR | Status: AC
  Administered 2015-10-27: 4 mg via INTRAVENOUS
  Filled 2015-10-27: qty 2

## 2015-10-27 MED ORDER — ASPIRIN EC 325 MG PO TBEC
325.0000 mg | DELAYED_RELEASE_TABLET | Freq: Once | ORAL | Status: AC
  Administered 2015-10-27: 325 mg via ORAL
  Filled 2015-10-27: qty 1

## 2015-10-27 MED ORDER — ASPIRIN EC 325 MG PO TBEC: 325.0000 mg | DELAYED_RELEASE_TABLET | Freq: Every day | ORAL | Status: DC

## 2015-10-27 NOTE — ED Notes (Signed)
Code  Stroke  Called  To  3333 

## 2015-10-27 NOTE — ED Notes (Signed)
Patient presents to the ED with headache, blurred vision, dizziness, and weakness that were sudden and began around 1:30pm.  Patient states, "this is the worst headache of my life."

## 2015-10-27 NOTE — ED Notes (Signed)
Pt comes into the ED via EMS from work, states he had sudden onset dizziness with nausea, worse with movement. States it helps to keep eyes closed.Marland Kitchen.denies Hx of similar sx in the past.. Denies HA..Marland Kitchen

## 2015-10-27 NOTE — ED Notes (Signed)
[facility], MRA, MRV.  Pt is considered too good to treat, keep pt in treatment window until 1600.  Monitor VS Q 15 minutes, complete modified NIHSS Q 30 minutes until 1600.  Notify physician if neuro status changes. Katie RN aware of plan.

## 2015-10-27 NOTE — ED Notes (Signed)
Pt to MRI

## 2015-10-27 NOTE — ED Provider Notes (Signed)
[facility] Emergency Department Provider Note  ____________________________________________  Time seen: Approximately 3:46 PM  I have reviewed the triage vital signs and the nursing notes.   HISTORY  Chief Complaint Dizziness and Code Stroke    HPI Darrell Freeman is a 60 y.o. male w/ a hx of HTN presenting w/ lightheadedness, ringing in the ears, and nausea.  The patient was under significant stress at work, he was standing when he had the progressive onset of a lightheaded sensation with some ringing in his ears and nausea but no vomiting. He denies any headache, numbness tingling or weakness, difficulty with vision or speech, confusion, difficulty walking. He went outside to his truck to lay down and lying down made it worse. He denies any cocaine use.   Past Medical History  Diagnosis Date  . Arthritis   . Hypertension   . GERD (gastroesophageal reflux disease)     There are no active problems to display for this patient.   Past Surgical History  Procedure Laterality Date  . Olecranon bursa excision    . Olecranon bursectomy Left 12/26/2014    Procedure: OLECRANON BURSA;  Surgeon: Myra Rude, MD;  Location: [facility] ORS;  Service: Orthopedics;  Laterality: Left;    Current Outpatient Rx  Name  Route  Sig  Dispense  Refill  . allopurinol (ZYLOPRIM) 300 MG tablet   Oral   Take 300 mg by mouth as needed.         Marland Kitchen aspirin EC 325 MG tablet   Oral   Take 1 tablet (325 mg total) by mouth daily.   30 tablet   0   . calcium carbonate (TUMS - DOSED IN MG ELEMENTAL CALCIUM) 500 MG chewable tablet   Oral   Chew 1 tablet by mouth as needed for indigestion or heartburn.         . hydrochlorothiazide (HYDRODIURIL) 25 MG tablet   Oral   Take 25 mg by mouth daily.         Marland Kitchen HYDROcodone-acetaminophen (NORCO) 5-325 MG per tablet   Oral   Take 1 tablet by mouth every 6 (six) hours as needed for moderate pain.   30 tablet   0   .  ibuprofen (ADVIL,MOTRIN) 200 MG tablet   Oral   Take 200 mg by mouth every 6 (six) hours as needed.         Marland Kitchen losartan (COZAAR) 50 MG tablet   Oral   Take 50 mg by mouth daily.          . ranitidine (ZANTAC) 150 MG tablet   Oral   Take 150 mg by mouth as needed for heartburn.           Allergies Review of patient's allergies indicates no known allergies.  No family history on file.  Social History Social History  Substance Use Topics  . Smoking status: Former Smoker -- 7 years    Types: Cigarettes    Quit date: 12/11/1984  . Smokeless tobacco: None  . Alcohol Use: Yes     Comment: OCC    Review of Systems Constitutional: No fever/chills.Positive lightheadedness. Negative syncope. Eyes: No visual changes. No blurred or double vision. ENT: No sore throat. No congestion or rhinorrhea. Cardiovascular: Denies chest pain. Denies palpitations. Respiratory: Denies shortness of breath.  No cough. Gastrointestinal: No abdominal pain.  No nausea, no vomiting.  No diarrhea.  No constipation. Denies any bright red blood per rectum or melena. Genitourinary: Negative for dysuria.  Musculoskeletal: Negative for back pain. Skin: Negative for rash. Neurological: Nursing notes as positive headache, but the patient denies that he is having any pain in his head. No numbness tingling or weakness. Positive "roaring" in his ears bilaterally.   10-point ROS otherwise negative.  ____________________________________________   PHYSICAL EXAM:  VITAL SIGNS: ED Triage Vitals  Enc Vitals Group     BP --      Pulse --      Resp --      Temp --      Temp src --      SpO2 --      Weight --      Height --      Head Cir --      Peak Flow --      Pain Score --      Pain Loc --      Pain Edu? --      Excl. in GC? --     Constitutional: Alert and oriented. Well appearing and in no acute distress. Answers questions appropriately. Eyes: Conjunctivae are normal.  EOMI. No scleral  icterus.PERRLA. Head: Atraumatic. Nose: No congestion/rhinnorhea. Mouth/Throat: Mucous membranes are moist.  Neck: No stridor.  Supple.  No JVD. No meningismus. Cardiovascular: Normal rate, regular rhythm. No murmurs, rubs or gallops.  Respiratory: Normal respiratory effort.  No accessory muscle use or retractions. Lungs CTAB.  No wheezes, rales or ronchi. Gastrointestinal: Soft, nontender and nondistended.  No guarding or rebound.  No peritoneal signs. Musculoskeletal: No LE edema. No ttp in the calves or palpable cords.  Negative Homan's sign. Neurologic: Alert and oriented 3. Speech is clear. Naming and repetition are intact. Face and smile symmetric. Tongue is midline. EOMI. PERRLA. No nystagmus horizontally or vertically. No pronator drift. 5 out of 5 grip, biceps, triceps, hip flexors, plantar flexion and dorsiflexion. Normal sensation to light touch in the left upper and lower extremities, and face. Decreased sensation to light touch in the right upper extremity and right lower extremity. Normal heel-to-shin. Skin:  Skin is warm, dry and intact. No rash noted. Psychiatric: Mood and affect are normal. Speech and behavior are normal.  Normal judgement.  ____________________________________________   LABS (all labs ordered are listed, but only abnormal results are displayed)  Labs Reviewed  APTT - Abnormal; Notable for the following:    aPTT <24 (*)    All other components within normal limits  COMPREHENSIVE METABOLIC PANEL - Abnormal; Notable for the following:    Glucose, Bld 128 (*)    BUN 25 (*)    Creatinine, Ser 1.32 (*)    GFR calc non Af Amer 58 (*)    All other components within normal limits  GLUCOSE, CAPILLARY - Abnormal; Notable for the following:    Glucose-Capillary 123 (*)    All other components within normal limits  PROTIME-INR  CBC  DIFFERENTIAL  TROPONIN I  CBG MONITORING, ED   ____________________________________________  EKG  ED ECG REPORT I,  Rockne Menghini, the attending physician, personally viewed and interpreted this ECG.   Date: 10/27/2015  EKG Time: 1538  Rate: 58  Rhythm: normal sinus rhythm; and complete right bundle branch block  Axis: Normal  Intervals:none  ST&T Change: Nonspecific T-wave inversion in V1. No ST elevation.  ____________________________________________  RADIOLOGY  Ct Head Wo Contrast  10/27/2015  CLINICAL DATA:  Sudden onset dizziness and nausea. Weakness and headache. EXAM: CT HEAD WITHOUT CONTRAST TECHNIQUE: Contiguous axial images were obtained from the base of the  skull through the vertex without intravenous contrast. COMPARISON:  09/23/2005 PET-CT FINDINGS: Partially empty sella. Density along the falx on image 21 series 2 is unusual and probably from volume averaging of a falcine calcification, but I cannot completely exclude isolated thrombosis of the inferior sagittal sinus. Otherwise, the brainstem, cerebellum, cerebral peduncles, thalami, basal ganglia, basilar cisterns, and ventricular system appear within normal limits. No intracranial hemorrhage, mass lesion, or acute CVA. IMPRESSION: 1. Unusual density along the falx, probably from volume averaging of a falcine calcification. Although less likely, I cannot completely exclude isolated thrombosis the inferior sagittal sinus. If further workup is warranted based on the clinical scenario, consider brain MRI with MR venography for further characterization. 2. Partially empty sella. These results were called by telephone at the time of interpretation on 10/27/2015 at 4:08 pm to Dr. Rockne MenghiniANNE-CAROLINE Cristopher Ciccarelli , who verbally acknowledged these results. Electronically Signed   By: Gaylyn RongWalter  Liebkemann M.D.   On: 10/27/2015 16:12   Mr Maxine GlennMra Head Wo Contrast  10/27/2015  CLINICAL DATA:  Lightheaded and tinnitus.  Abnormal CT. EXAM: MRI HEAD WITHOUT CONTRAST MRA HEAD WITHOUT CONTRAST TECHNIQUE: Multiplanar, multiecho pulse sequences of the brain and surrounding  structures were obtained without intravenous contrast. Angiographic images of the head were obtained using MRA technique without contrast. COMPARISON:  CT head 10/27/2015 FINDINGS: MRI HEAD FINDINGS Image quality degraded by motion on some sequences. Negative for acute infarct. Scattered small subcortical white matter hyperintensities bilaterally likely due to chronic microvascular ischemia. Brainstem and cerebellum normal. Normal basal ganglia. Negative for intracranial hemorrhage Ventricle size is normal.  Cerebral volume is normal. Negative for mass or edema.  No shift of the midline structures. Hyperdensity along the inferior margin of the falx anteriorly on CT is not well seen by MRI. No evidence of hemorrhage or mass or venous thrombosis in this area. Pituitary and skull base normal. Normal orbit. Paranasal sinuses demonstrate mild mucosal edema. MRA HEAD FINDINGS There is moderate stenosis of the distal right vertebral artery and mild stenosis distal left vertebral artery. Basilar is patent. Superior cerebellar and posterior cerebral arteries patent bilaterally. Fetal origin right posterior cerebral artery with hypoplastic right P1 segment. Cavernous carotid widely patent bilaterally without stenosis or aneurysm. Anterior and middle cerebral arteries widely patent without stenosis or aneurysm. IMPRESSION: Negative for acute infarct. Scattered small subcortical white matter hyperintensities are small and likely due to chronic microvascular ischemia. Negative MRA head. Electronically Signed   By: Marlan Palauharles  Clark M.D.   On: 10/27/2015 18:45   Mr Angiogram Neck W Wo Contrast  10/27/2015  CLINICAL DATA:  Headache and dizziness. Lightheaded. Tinnitus. Abnormal CT. EXAM: MRA NECK WITHOUT AND WITH CONTRAST TECHNIQUE: Multiplanar and multiecho pulse sequences of the neck were obtained without and with intravenous contrast. Angiographic images of the neck were obtained using MRA technique without and with intravenous  contrast. CONTRAST:  17mL MULTIHANCE GADOBENATE DIMEGLUMINE 529 MG/ML IV SOLN COMPARISON:  None. FINDINGS: Mild atherosclerotic disease in the carotid bulb on the left without significant stenosis. Remaining left carotid patent. Right carotid widely patent without evidence of atherosclerotic disease Both vertebral arteries are patent. Moderate stenosis distal right vertebral artery. Left vertebral artery widely patent. IMPRESSION: No significant carotid stenosis Moderate stenosis distal right vertebral artery. Electronically Signed   By: Marlan Palauharles  Clark M.D.   On: 10/27/2015 18:49   Mr Brain Wo Contrast  10/27/2015  CLINICAL DATA:  Lightheaded and tinnitus.  Abnormal CT. EXAM: MRI HEAD WITHOUT CONTRAST MRA HEAD WITHOUT CONTRAST TECHNIQUE: Multiplanar, multiecho pulse  sequences of the brain and surrounding structures were obtained without intravenous contrast. Angiographic images of the head were obtained using MRA technique without contrast. COMPARISON:  CT head 10/27/2015 FINDINGS: MRI HEAD FINDINGS Image quality degraded by motion on some sequences. Negative for acute infarct. Scattered small subcortical white matter hyperintensities bilaterally likely due to chronic microvascular ischemia. Brainstem and cerebellum normal. Normal basal ganglia. Negative for intracranial hemorrhage Ventricle size is normal.  Cerebral volume is normal. Negative for mass or edema.  No shift of the midline structures. Hyperdensity along the inferior margin of the falx anteriorly on CT is not well seen by MRI. No evidence of hemorrhage or mass or venous thrombosis in this area. Pituitary and skull base normal. Normal orbit. Paranasal sinuses demonstrate mild mucosal edema. MRA HEAD FINDINGS There is moderate stenosis of the distal right vertebral artery and mild stenosis distal left vertebral artery. Basilar is patent. Superior cerebellar and posterior cerebral arteries patent bilaterally. Fetal origin right posterior cerebral artery  with hypoplastic right P1 segment. Cavernous carotid widely patent bilaterally without stenosis or aneurysm. Anterior and middle cerebral arteries widely patent without stenosis or aneurysm. IMPRESSION: Negative for acute infarct. Scattered small subcortical white matter hyperintensities are small and likely due to chronic microvascular ischemia. Negative MRA head. Electronically Signed   By: Marlan Palau M.D.   On: 10/27/2015 18:45   Mr [place]  10/27/2015  CLINICAL DATA:  Lightheadedness and headache. Abnormal CT. Rule out venous thrombosis. EXAM: MR VENOGRAM the HEAD WITHOUT CONTRAST TECHNIQUE: Angiographic images of the intracranial venous structures were obtained using MRV technique without intravenous contrast. COMPARISON:  CT head today FINDINGS: Superior sagittal sinus normal. Inferior sagittal sinus is widely patent. This is the area of density on CT which may represent calcification of the falx. Straight sinus is patent. Transverse and sigmoid sinus patent bilaterally. IMPRESSION: Negative for venous sinus thrombosis. Electronically Signed   By: Marlan Palau M.D.   On: 10/27/2015 18:47    ____________________________________________   PROCEDURES  Procedure(s) performed: None  Critical Care performed: No ____________________________________________   INITIAL IMPRESSION / ASSESSMENT AND PLAN / ED COURSE  Pertinent labs & imaging results that were available during my care of the patient were reviewed by me and considered in my medical decision making (see chart for details).  60 y.o. male with a history of HTN, presenting with lightheadedness, tinnitus, and decreased sensation in the right upper and lower extremities. The patient has reassuring vital signs with a blood pressure of 152/98. He has several nonspecific findings on my exam, but does have decreased sensation in the right upper and lower extremities so I have called [place] so CT medicine for neurologic consult. I have  received the results of the patient's CT scan, which is grossly within normal limits but there appears to be some possible calcification around the falx, and it is recommended to follow-up with an MRI of the brain and an MRV. We'll also look for other causes of lightheadedness and tinnitus, including vertigo, dehydration or hypovolemia, electrolyte abnormalities.  ----------------------------------------- 5:41 PM on 10/27/2015 -----------------------------------------  The patient's orthostatic vital signs are within normal limits. His laboratory studies are also reassuring with the exception of a mild renal insufficiency and elevated BUN. It is possible that this is from mild dehydration, although the patient has a previous normal creatinine, but one from several years ago that also showed some mild renal insufficiency, so it is unclear whether this may be baseline. In any case, it will not hurt  to encourage the patient to hydrate and have this rechecked by his primary care physician.  I am awaiting the results of the patient's MRI studies. He has been seen by the [facility] neurologist, who agrees that acute stroke is unlikely. The recommendation is for admission although since I'm able to perform the MRI studies from the emergency department, if these are negative and the patient's symptoms have resolved, I will consider his final disposition.  ----------------------------------------- 7:07 PM on 10/27/2015 -----------------------------------------  The patient states that he is feeling significantly better. His lightheadedness has improved, and his tinnitus is minimal at this time. I will additionally try to treat him with meclizine and see if this helps his symptoms. His MRI results are reassuring with no evidence of acute stroke. He does have moderate stenosis of the right vertebral artery with a patent left vertebral artery, but it is not clear that this would be the cause of his symptoms. I will  also confer with the neurologist on-call for final disposition.  ----------------------------------------- 7:20 PM on 10/27/2015 -----------------------------------------  I was able to speak with Dr. Elta Guadeloupe, neurology on call, who reviewed the patient's MR results and has recommended a full aspirin now and then a daily aspirin afterwards with outpatient neurology follow-up. We both agree that at this time, no further workup as an inpatient as needed. I will convey that to the patient, as well as give him strict return precautions and follow-up instructions.  ____________________________________________  FINAL CLINICAL IMPRESSION(S) / ED DIAGNOSES  Final diagnoses:  Lightheadedness  Vertebral artery stenosis, right  Tinnitus, bilateral  Numbness      NEW MEDICATIONS STARTED DURING THIS VISIT:  New Prescriptions   ASPIRIN EC 325 MG TABLET    Take 1 tablet (325 mg total) by mouth daily.     Rockne Menghini, MD 10/27/15 1925

## 2015-10-27 NOTE — ED Notes (Signed)
PT reports extreme dizziness last 1330 today; pt states "I've never been this dizzy before". Pt neurologically intact, no deficits noted.

## 2015-10-27 NOTE — Discharge Instructions (Signed)
Please start taking a daily aspirin, and follow-up with the neurologist.  Today, one of the arteries in your brain, the vertebral artery, has some stenosis. This may be one possible cause of her symptoms.  Return to the emergency department if you develop severe pain, headache, numbness tingling or weakness, dizziness or fainting, or any other symptoms concerning to you.

## 2015-10-27 NOTE — ED Notes (Signed)
Soc  Report  Given  To  Dr Sharma [name]

## 2016-04-11 ENCOUNTER — Other Ambulatory Visit: Payer: Self-pay | Admitting: Family Medicine

## 2017-03-14 ENCOUNTER — Emergency Department
Admission: EM | Admit: 2017-03-14 | Discharge: 2017-03-14 | Disposition: A | Discharge status: Home / Self Care | Payer: 59 | Source: Home / Self Care | Attending: Emergency Medicine | Admitting: Emergency Medicine

## 2017-03-14 ENCOUNTER — Emergency Department: Payer: 59

## 2017-03-14 ENCOUNTER — Inpatient Hospital Stay
Admission: EM | Admit: 2017-03-14 | Discharge: 2017-03-16 | DRG: 379 | Disposition: A | Discharge status: Home / Self Care | Payer: 59 | Attending: Internal Medicine | Admitting: Internal Medicine

## 2017-03-14 ENCOUNTER — Encounter: Payer: Self-pay | Admitting: Emergency Medicine

## 2017-03-14 DIAGNOSIS — K625 Hemorrhage of anus and rectum: Secondary | ICD-10-CM

## 2017-03-14 DIAGNOSIS — Z87891 Personal history of nicotine dependence: Secondary | ICD-10-CM

## 2017-03-14 DIAGNOSIS — K222 Esophageal obstruction: Secondary | ICD-10-CM | POA: Diagnosis present

## 2017-03-14 DIAGNOSIS — K922 Gastrointestinal hemorrhage, unspecified: Secondary | ICD-10-CM

## 2017-03-14 DIAGNOSIS — D500 Iron deficiency anemia secondary to blood loss (chronic): Secondary | ICD-10-CM | POA: Diagnosis present

## 2017-03-14 DIAGNOSIS — K210 Gastro-esophageal reflux disease with esophagitis: Secondary | ICD-10-CM | POA: Diagnosis present

## 2017-03-14 DIAGNOSIS — I10 Essential (primary) hypertension: Secondary | ICD-10-CM | POA: Insufficient documentation

## 2017-03-14 DIAGNOSIS — Z800 Family history of malignant neoplasm of digestive organs: Secondary | ICD-10-CM

## 2017-03-14 DIAGNOSIS — K640 First degree hemorrhoids: Secondary | ICD-10-CM | POA: Diagnosis present

## 2017-03-14 DIAGNOSIS — K921 Melena: Secondary | ICD-10-CM | POA: Diagnosis present

## 2017-03-14 DIAGNOSIS — Z7982 Long term (current) use of aspirin: Secondary | ICD-10-CM | POA: Insufficient documentation

## 2017-03-14 DIAGNOSIS — K5731 Diverticulosis of large intestine without perforation or abscess with bleeding: Secondary | ICD-10-CM | POA: Diagnosis not present

## 2017-03-14 DIAGNOSIS — T39395A Adverse effect of other nonsteroidal anti-inflammatory drugs [NSAID], initial encounter: Secondary | ICD-10-CM | POA: Diagnosis present

## 2017-03-14 DIAGNOSIS — Z79899 Other long term (current) drug therapy: Secondary | ICD-10-CM

## 2017-03-14 DIAGNOSIS — K219 Gastro-esophageal reflux disease without esophagitis: Secondary | ICD-10-CM | POA: Diagnosis present

## 2017-03-14 DIAGNOSIS — K449 Diaphragmatic hernia without obstruction or gangrene: Secondary | ICD-10-CM | POA: Diagnosis present

## 2017-03-14 DIAGNOSIS — D509 Iron deficiency anemia, unspecified: Secondary | ICD-10-CM

## 2017-03-14 DIAGNOSIS — M1990 Unspecified osteoarthritis, unspecified site: Secondary | ICD-10-CM | POA: Diagnosis present

## 2017-03-14 HISTORY — DX: Diverticulosis of intestine, part unspecified, without perforation or abscess without bleeding: K57.90

## 2017-03-14 HISTORY — DX: Gastrointestinal hemorrhage, unspecified: K92.2

## 2017-03-14 LAB — COMPREHENSIVE METABOLIC PANEL
ALBUMIN: 3.9 g/dL (ref 3.5–5.0)
ALBUMIN: 3.5 g/dL (ref 3.5–5.0)
ALK PHOS: 61 U/L (ref 38–126)
ALK PHOS: 57 U/L (ref 38–126)
ALT: 21 U/L (ref 17–63)
ALT: 19 U/L (ref 17–63)
ANION GAP: 8 (ref 5–15)
ANION GAP: 11 (ref 5–15)
AST: 22 U/L (ref 15–41)
AST: 21 U/L (ref 15–41)
BUN: 19 mg/dL (ref 6–20)
BUN: 19 mg/dL (ref 6–20)
CALCIUM: 9.5 mg/dL (ref 8.9–10.3)
CALCIUM: 8.9 mg/dL (ref 8.9–10.3)
CHLORIDE: 107 mmol/L (ref 101–111)
CO2: 23 mmol/L (ref 22–32)
CO2: 22 mmol/L (ref 22–32)
Chloride: 107 mmol/L (ref 101–111)
Creatinine, Ser: 1.3 mg/dL — ABNORMAL HIGH (ref 0.61–1.24)
Creatinine, Ser: 1.41 mg/dL — ABNORMAL HIGH (ref 0.61–1.24)
GFR calc Af Amer: >60 mL/min (ref >60)
GFR calc non Af Amer: 59 mL/min — ABNORMAL LOW (ref >60)
GFR calc non Af Amer: 53 mL/min — ABNORMAL LOW (ref >60)
GLUCOSE: 117 mg/dL — AB (ref 65–99)
GLUCOSE: 156 mg/dL — AB (ref 65–99)
POTASSIUM: 3.9 mmol/L (ref 3.5–5.1)
POTASSIUM: 3.6 mmol/L (ref 3.5–5.1)
SODIUM: 140 mmol/L (ref 135–145)
Sodium: 138 mmol/L (ref 135–145)
TOTAL PROTEIN: 6.3 g/dL — AB (ref 6.5–8.1)
Total Bilirubin: 0.8 mg/dL (ref 0.3–1.2)
Total Bilirubin: 0.7 mg/dL (ref 0.3–1.2)
Total Protein: 7 g/dL (ref 6.5–8.1)

## 2017-03-14 LAB — CBC WITH DIFFERENTIAL/PLATELET
Basophils Absolute: 0 10*3/uL (ref 0–0.1)
Basophils Relative: 1 %
EOS PCT: 4 %
Eosinophils Absolute: 0.2 10*3/uL (ref 0–0.7)
HEMATOCRIT: 33.3 % — AB (ref 40.0–52.0)
Hemoglobin: 11.4 g/dL — ABNORMAL LOW (ref 13.0–18.0)
LYMPHS ABS: 1.7 10*3/uL (ref 1.0–3.6)
LYMPHS PCT: 27 %
MCH: 29.9 pg (ref 26.0–34.0)
MCHC: 34.1 g/dL (ref 32.0–36.0)
MCV: 87.8 fL (ref 80.0–100.0)
MONO ABS: 1 10*3/uL (ref 0.2–1.0)
MONOS PCT: 16 %
NEUTROS ABS: 3.2 10*3/uL (ref 1.4–6.5)
NEUTROS PCT: 52 %
PLATELETS: 197 10*3/uL (ref 150–440)
RBC: 3.8 MIL/uL — ABNORMAL LOW (ref 4.40–5.90)
RDW: 13.4 % (ref 11.5–14.5)
WBC: 6.2 10*3/uL (ref 3.8–10.6)

## 2017-03-14 LAB — CBC
HCT: 27.6 % — ABNORMAL LOW (ref 40.0–52.0)
Hemoglobin: 9.6 g/dL — ABNORMAL LOW (ref 13.0–18.0)
MCH: 30.3 pg (ref 26.0–34.0)
MCHC: 34.9 g/dL (ref 32.0–36.0)
MCV: 86.7 fL (ref 80.0–100.0)
Platelets: 197 10*3/uL (ref 150–440)
RBC: 3.18 MIL/uL — ABNORMAL LOW (ref 4.40–5.90)
RDW: 13.5 % (ref 11.5–14.5)
WBC: 6.1 10*3/uL (ref 3.8–10.6)

## 2017-03-14 LAB — PROTIME-INR
INR: 0.96
PROTHROMBIN TIME: 12.7 s (ref 11.4–15.2)

## 2017-03-14 MED ORDER — IOPAMIDOL (ISOVUE-300) INJECTION 61%: 30.0000 mL | Freq: Once | INTRAVENOUS | Status: DC | PRN

## 2017-03-14 MED ORDER — SODIUM CHLORIDE 0.9 % IV BOLUS (SEPSIS)
1000.0000 mL | Freq: Once | INTRAVENOUS | Status: AC
  Administered 2017-03-14: 1000 mL via INTRAVENOUS

## 2017-03-14 MED ORDER — IOPAMIDOL (ISOVUE-300) INJECTION 61%
100.0000 mL | Freq: Once | INTRAVENOUS | Status: AC | PRN
  Administered 2017-03-14: 100 mL via INTRAVENOUS

## 2017-03-14 NOTE — ED Notes (Signed)
Pt states that his regular doctor told him to come in because of his Hgb was low (10.4).

## 2017-03-14 NOTE — ED Triage Notes (Signed)
Pt reports was seen by his MD today and had blood work done and they told him to come to the ED. Pt denies all sx's, denies pain. Pt reports he does have some blood in his stool, reports that the blood is dark.

## 2017-03-14 NOTE — ED Notes (Addendum)
Went to discharge the pt, obtain E-signature, and take discharge vital signs but pt was not in the room. Asked fellow nurses if any of them removed his IV or discharged him but no one admitted to doing so. Called 911 as a precaution to alert them that the pt left an IV in his arm so they can follow up or have patient return to facility.

## 2017-03-14 NOTE — ED Notes (Addendum)
Pt came back to ED to clear up any confusion and stated that a "nurse" removed his IV and told him "he was good" so he left facility.  Pt was given his discharge papers and pt gave verbal consent to discharge.

## 2017-03-14 NOTE — ED Provider Notes (Signed)
[facility] Emergency Department Provider Note  ____________________________________________   First MD Initiated Contact with Patient 03/14/17 1504     (approximate)  I have reviewed the triage vital signs and the nursing notes.   HISTORY  Chief Complaint Blood In Stools   HPI BAER Darrell Freeman is a 61 y.o. male with history diverticulosis with lower GI bleeding was presenting to the emergency department today with multiple episodes of lower GI bleeding. He says that he started to feel "bubbling" in his stomach this past Saturday. He then proceeded to have multiple episodes of dark, maroon colored stool over the weekend. However, he says that he has only had one episode per day over the past 2 days and has been more formed and less liquidy but still with maroon stool. He is denying any abdominal pain. Denies any nausea or vomiting. He was sent to the emergency department from his primary care doctor's office because of a 4.2. His hemoglobin over the past year. The patient is denying any shortness of breath, lightheadedness or weakness or palpitations.   Past Medical History:  Diagnosis Date  . Arthritis   . GERD (gastroesophageal reflux disease)   . Hypertension     There are no active problems to display for this patient.   Past Surgical History:  Procedure Laterality Date  . OLECRANON BURSA EXCISION    . OLECRANON BURSECTOMY Left 12/26/2014   Procedure: OLECRANON BURSA;  Surgeon: Myra Rude, MD;  Location: [facility] ORS;  Service: Orthopedics;  Laterality: Left;    Prior to Admission medications   Medication Sig Start Date End Date Taking? Authorizing Provider  allopurinol (ZYLOPRIM) 300 MG tablet Take 300 mg by mouth as needed.    [provider]  aspirin EC 325 MG tablet Take 1 tablet (325 mg total) by mouth daily. 10/27/15   Rockne Menghini, MD  calcium carbonate (TUMS - DOSED IN MG ELEMENTAL CALCIUM) 500 MG chewable tablet Chew 1  tablet by mouth as needed for indigestion or heartburn.    [provider]  hydrochlorothiazide (HYDRODIURIL) 25 MG tablet Take 25 mg by mouth daily.    [provider]  HYDROcodone-acetaminophen (NORCO) 5-325 MG per tablet Take 1 tablet by mouth every 6 (six) hours as needed for moderate pain. 12/26/14   Myra Rude, MD  ibuprofen (ADVIL,MOTRIN) 200 MG tablet Take 200 mg by mouth every 6 (six) hours as needed.    [provider]  losartan (COZAAR) 50 MG tablet Take 50 mg by mouth daily.     [provider]  ranitidine (ZANTAC) 150 MG tablet Take 150 mg by mouth as needed for heartburn.    [provider]    Allergies Patient has no known allergies.  No family history on file.  Social History Social History  Substance Use Topics  . Smoking status: Former Smoker    Years: 7.00    Types: Cigarettes    Quit date: 12/11/1984  . Smokeless tobacco: Not on file  . Alcohol use Yes     Comment: OCC    Review of Systems  Constitutional: No fever/chills Eyes: No visual changes. ENT: No sore throat. Cardiovascular: Denies chest pain. Respiratory: Denies shortness of breath. Gastrointestinal: No abdominal pain.  No nausea, no vomiting.   No constipation. Genitourinary: Negative for dysuria. Musculoskeletal: Negative for back pain. Skin: Negative for rash. Neurological: Negative for headaches, focal weakness or numbness.   ____________________________________________   PHYSICAL EXAM:  VITAL SIGNS: ED Triage Vitals  Enc Vitals Group     BP 03/14/17 1142 (!) 135/91     Pulse Rate 03/14/17 1142 84     Resp 03/14/17 1142 20     Temp 03/14/17 1142 98 F (36.7 C)     Temp Source 03/14/17 1142 Oral     SpO2 03/14/17 1142 99 %     Weight 03/14/17 1143 178 lb (80.7 kg)     Height 03/14/17 1143  (1.651 m)     Head Circumference --      Peak Flow --      Pain Score --      Pain Loc --      Pain Edu? --      Excl. in GC? --      Constitutional: Alert and oriented. Well appearing and in no acute distress. Eyes: Conjunctivae are normal.  Head: Atraumatic. Nose: No congestion/rhinnorhea. Mouth/Throat: Mucous membranes are moist.  Neck: No stridor.   Cardiovascular: Normal rate, regular rhythm. Grossly normal heart sounds.   Respiratory: Normal respiratory effort.  No retractions. Lungs CTAB. Gastrointestinal: Soft and nontender. No distention.  digital rectal exam positive for loose, maroon bloody stool. Musculoskeletal: No lower extremity tenderness nor edema.  No joint effusions. Neurologic:  Normal speech and language. No gross focal neurologic deficits are appreciated. Skin:  Skin is warm, dry and intact. No rash noted. Psychiatric: Mood and affect are normal. Speech and behavior are normal.  ____________________________________________   LABS (all labs ordered are listed, but only abnormal results are displayed)  Labs Reviewed  COMPREHENSIVE METABOLIC PANEL - Abnormal; Notable for the following:       Result Value   Glucose, Bld 117 (*)    Creatinine, Ser 1.30 (*)    GFR calc non Af Amer 59 (*)    All other components within normal limits  PROTIME-INR  CBC WITH DIFFERENTIAL/PLATELET   ____________________________________________  EKG   ____________________________________________  RADIOLOGY   ____________________________________________   PROCEDURES  Procedure(s) performed:   Procedures  Critical Care performed:   ____________________________________________   INITIAL IMPRESSION / ASSESSMENT AND PLAN / ED COURSE  Pertinent labs & imaging results that were available during my care of the patient were reviewed by me and considered in my medical decision making (see chart for details).  DDX: Diverticular bleed, bleeding hemorrhoids, upper GI bleeding with rapid transit    ----------------------------------------- 6:31 PM on  03/14/2017 -----------------------------------------  Patient with a hemoglobin level that is now 11.4 from 10.6 earlier today. He has not had any further episodes of blood per rectum. I discussed case Dr. Marquis Buggy and she says that it is up to my discretion whether to discharge the patient home or not. However, he does recommended a follow-up in the clinic in 2-3 days. The patient would prefer to go home. We discussed return precautions such as any worsening of his bleeding, palpitations, worsening weakness or any worsening or concerning symptoms. He is understanding of this plan one to comply. Will be discharged home at this time.  ____________________________________________   FINAL CLINICAL IMPRESSION(S) / ED DIAGNOSES  lower GI bleeding. Likely diverticular.    NEW MEDICATIONS STARTED DURING THIS VISIT:  New Prescriptions   No medications on file     Note:  This document was prepared using Dragon voice recognition software and may include unintentional dictation errors.     Myrna Blazer, MD 03/14/17 [id number]

## 2017-03-14 NOTE — ED Triage Notes (Signed)
Patient returns to ED for multiple episodes of rectal bleeding. Seen here four hours ago for same. States when he got home it got worse and was told to return. Patient is very pale in triage and diaphoretic.

## 2017-03-14 NOTE — ED Provider Notes (Signed)
[facility] Emergency Department Provider Note     First MD Initiated Contact with Patient 03/14/17 2315     (approximate)  I have reviewed the triage vital signs and the nursing notes.   HISTORY  Chief Complaint Rectal Bleeding    HPI Darrell Freeman is a 61 y.o. male with history of diverticulitis with lower GI bleed in the past with returns to the emergency department after being evaluated earlier today following multiple episodes of lower GI bleeding. Patient states after being evaluated in the emergency department he went home and had additional episodes of bright red blood per rectum which he describes as "large". He denies any nausea no vomiting.    Past Medical History:  Diagnosis Date  . Arthritis   . Diverticulosis   . GERD (gastroesophageal reflux disease)   . GI bleed   . Hypertension     Patient Active Problem List   Diagnosis Date Noted  . GI bleed 03/15/2017  . HTN (hypertension) 03/15/2017  . GERD (gastroesophageal reflux disease) 03/15/2017    Past Surgical History:  Procedure Laterality Date  . OLECRANON BURSA EXCISION    . OLECRANON BURSECTOMY Left 12/26/2014   Procedure: OLECRANON BURSA;  Surgeon: Myra Rude, MD;  Location: [facility] ORS;  Service: Orthopedics;  Laterality: Left;    Prior to Admission medications   Medication Sig Start Date End Date Taking? Authorizing Provider  allopurinol (ZYLOPRIM) 300 MG tablet Take 300 mg by mouth as needed.   Yes [provider]  aspirin EC 325 MG tablet Take 1 tablet (325 mg total) by mouth daily. Patient taking differently: Take 81 mg by mouth daily.  10/27/15  Yes Rockne Menghini, MD  atorvastatin (LIPITOR) 10 MG tablet Take 1 tablet by mouth daily. 03/10/17  Yes [provider]  ibuprofen (ADVIL,MOTRIN) 200 MG tablet Take 200 mg by mouth every 6 (six) hours as needed.   Yes [provider]  losartan (COZAAR) 50 MG tablet Take 50 mg by mouth  daily.    Yes [provider]  zolpidem (AMBIEN) 10 MG tablet Take 5-10 mg by mouth at bedtime as needed for sleep.   Yes [provider]  HYDROcodone-acetaminophen (NORCO) 5-325 MG per tablet Take 1 tablet by mouth every 6 (six) hours as needed for moderate pain. Patient not taking: Reported on 03/14/2017 12/26/14   Myra Rude, MD    Allergies Patient has no known allergies.  History reviewed. No pertinent family history.  Social History Social History  Substance Use Topics  . Smoking status: Former Smoker    Years: 7.00    Types: Cigarettes    Quit date: 12/11/1984  . Smokeless tobacco: Never Used  . Alcohol use Yes     Comment: OCC    Review of Systems Constitutional: No fever/chills Eyes: No visual changes. ENT: No sore throat. Cardiovascular: Denies chest pain. Respiratory: Denies shortness of breath. Gastrointestinal: No abdominal pain.  No nausea, no vomiting.  No diarrhea.  No constipation. Genitourinary: Negative for dysuria. Musculoskeletal: Negative for back pain. Skin: Negative for rash. Neurological: Negative for headaches, focal weakness or numbness.   ____________________________________________   PHYSICAL EXAM:  VITAL SIGNS: ED Triage Vitals  Enc Vitals Group     BP --      Pulse Rate 03/14/17 2313 (!) 106     Resp 03/14/17 2313 20     Temp 03/14/17 2313 99.7 F (37.6 C)     Temp Source 03/14/17 2313 Oral  SpO2 03/14/17 2313 99 %     Weight 03/14/17 2314 80.7 kg (178 lb)     Height 03/14/17 2314 1.651 m ( )     Head Circumference --      Peak Flow --      Pain Score 03/14/17 2312 0     Pain Loc --      Pain Edu? --      Excl. in GC? --     Constitutional: Alert and oriented. Well appearing and in no acute distress. Eyes: Conjunctivae are Pale PERRL. EOMI. Head: Atraumatic. Nose: No congestion/rhinnorhea. Mouth/Throat: Mucous membranes are pale.  Oropharynx non-erythematous. Neck: No stridor.    Cardiovascular: Tachycardia, regular rhythm. Grossly normal heart sounds.  Good peripheral circulation. Respiratory: Normal respiratory effort.  No retractions. Lungs CTAB. Gastrointestinal: Left lower quadrant pain. No distention. No abdominal bruits. No CVA tenderness.Guaiac positive Musculoskeletal: No lower extremity tenderness nor edema.  No joint effusions. Neurologic:  Normal speech and language. No gross focal neurologic deficits are appreciated. No gait instability. Skin:  Skin is warm, dry and intact. No rash noted. Psychiatric: Mood and affect are normal. Speech and behavior are normal.  ____________________________________________   LABS (all labs ordered are listed, but only abnormal results are displayed)  Labs Reviewed  CBC - Abnormal; Notable for the following:       Result Value   RBC 3.18 (*)    Hemoglobin 9.6 (*)    HCT 27.6 (*)    All other components within normal limits  COMPREHENSIVE METABOLIC PANEL - Abnormal; Notable for the following:    Glucose, Bld 156 (*)    Creatinine, Ser 1.41 (*)    Total Protein 6.3 (*)    GFR calc non Af Amer 53 (*)    All other components within normal limits  CBC WITH DIFFERENTIAL/PLATELET - Abnormal; Notable for the following:    RBC 2.44 (*)    Hemoglobin 7.2 (*)    HCT 21.3 (*)    Lymphs Abs 0.9 (*)    All other components within normal limits  HEMOGLOBIN - Abnormal; Notable for the following:    Hemoglobin 8.1 (*)    All other components within normal limits  HEMOGLOBIN - Abnormal; Notable for the following:    Hemoglobin 8.4 (*)    All other components within normal limits  HEMOGLOBIN - Abnormal; Notable for the following:    Hemoglobin 8.1 (*)    All other components within normal limits  BASIC METABOLIC PANEL - Abnormal; Notable for the following:    Chloride 114 (*)    Glucose, Bld 113 (*)    Calcium 7.8 (*)    Anion gap 3 (*)    All other components within normal limits  CBC - Abnormal; Notable for the  following:    RBC 2.25 (*)    Hemoglobin 6.8 (*)    HCT 19.8 (*)    Platelets 135 (*)    All other components within normal limits  HEMOGLOBIN - Abnormal; Notable for the following:    Hemoglobin 8.0 (*)    All other components within normal limits  HIV ANTIBODY (ROUTINE TESTING)  H. PYLORI ANTIGEN, STOOL  HEMOGLOBIN  HEMOGLOBIN  HEMOGLOBIN  TYPE AND SCREEN  PREPARE RBC (CROSSMATCH)  ABO/RH   ____________________________________________  EKG   Procedures  Critical Care performed: CRITICAL CARE Performed by: Darci Current   Total critical care time: 30 minutes  Critical care time was exclusive of separately billable procedures and treating other  patients.  Critical care was necessary to treat or prevent imminent or life-threatening deterioration.  Critical care was time spent personally by me on the following activities: development of treatment plan with patient and/or surrogate as well as nursing, discussions with consultants, evaluation of patient's response to treatment, examination of patient, obtaining history from patient or surrogate, ordering and performing treatments and interventions, ordering and review of laboratory studies, ordering and review of radiographic studies, pulse oximetry and re-evaluation of patient's condition.   INITIAL IMPRESSION / ASSESSMENT AND PLAN / ED COURSE  As part of my medical decision making, I reviewed the following data within the electronic MEDICAL RECORD [id number] year old male presenting with above stated history of physical exam of lower GI bleed. Suspected etiology of the patient's bleed to be secondary to diverticulosis and a such CT scan of the abdomen was performed which revealed before mentioned. Patient's hemoglobin markedly decreased since previous with expectation that it will decrease further and a such patient type and crossed for packed red blood cells. Patient discussed with Dr. Sheryle Hail for hospital admission for further  evaluation and management.      ____________________________________________   FINAL CLINICAL IMPRESSION(S) / ED DIAGNOSES  Final diagnoses:  Lower GI bleed  Iron deficiency anemia, unspecified iron deficiency anemia type      NEW MEDICATIONS STARTED DURING THIS VISIT:  Current Discharge Medication List       Note:  This document was prepared using Dragon voice recognition software and may include unintentional dictation errors.   Darci Current, MD 03/17/17 [id number]

## 2017-03-14 NOTE — ED Notes (Signed)
Patient transported to CT 

## 2017-03-14 NOTE — ED Notes (Signed)
Pt assisted to toilet in room. Pt had bright red stool.

## 2017-03-15 ENCOUNTER — Telehealth: Payer: Self-pay | Admitting: Gastroenterology

## 2017-03-15 ENCOUNTER — Encounter: Payer: Self-pay | Admitting: Internal Medicine

## 2017-03-15 DIAGNOSIS — Z791 Long term (current) use of non-steroidal anti-inflammatories (NSAID): Secondary | ICD-10-CM

## 2017-03-15 DIAGNOSIS — Z87891 Personal history of nicotine dependence: Secondary | ICD-10-CM | POA: Diagnosis not present

## 2017-03-15 DIAGNOSIS — K209 Esophagitis, unspecified: Secondary | ICD-10-CM | POA: Diagnosis not present

## 2017-03-15 DIAGNOSIS — K921 Melena: Secondary | ICD-10-CM | POA: Diagnosis present

## 2017-03-15 DIAGNOSIS — K922 Gastrointestinal hemorrhage, unspecified: Secondary | ICD-10-CM | POA: Diagnosis present

## 2017-03-15 DIAGNOSIS — D500 Iron deficiency anemia secondary to blood loss (chronic): Secondary | ICD-10-CM | POA: Diagnosis present

## 2017-03-15 DIAGNOSIS — I10 Essential (primary) hypertension: Secondary | ICD-10-CM | POA: Diagnosis present

## 2017-03-15 DIAGNOSIS — Z7982 Long term (current) use of aspirin: Secondary | ICD-10-CM | POA: Diagnosis not present

## 2017-03-15 DIAGNOSIS — K210 Gastro-esophageal reflux disease with esophagitis: Secondary | ICD-10-CM | POA: Diagnosis present

## 2017-03-15 DIAGNOSIS — M1990 Unspecified osteoarthritis, unspecified site: Secondary | ICD-10-CM | POA: Diagnosis present

## 2017-03-15 DIAGNOSIS — T39395A Adverse effect of other nonsteroidal anti-inflammatory drugs [NSAID], initial encounter: Secondary | ICD-10-CM | POA: Diagnosis present

## 2017-03-15 DIAGNOSIS — K219 Gastro-esophageal reflux disease without esophagitis: Secondary | ICD-10-CM | POA: Diagnosis present

## 2017-03-15 DIAGNOSIS — K222 Esophageal obstruction: Secondary | ICD-10-CM | POA: Diagnosis present

## 2017-03-15 DIAGNOSIS — K5730 Diverticulosis of large intestine without perforation or abscess without bleeding: Secondary | ICD-10-CM | POA: Diagnosis not present

## 2017-03-15 DIAGNOSIS — K449 Diaphragmatic hernia without obstruction or gangrene: Secondary | ICD-10-CM | POA: Diagnosis present

## 2017-03-15 DIAGNOSIS — K5731 Diverticulosis of large intestine without perforation or abscess with bleeding: Secondary | ICD-10-CM | POA: Diagnosis present

## 2017-03-15 DIAGNOSIS — K640 First degree hemorrhoids: Secondary | ICD-10-CM | POA: Diagnosis present

## 2017-03-15 DIAGNOSIS — Z800 Family history of malignant neoplasm of digestive organs: Secondary | ICD-10-CM | POA: Diagnosis not present

## 2017-03-15 LAB — CBC
HCT: 19.8 % — ABNORMAL LOW (ref 40.0–52.0)
Hemoglobin: 6.8 g/dL — ABNORMAL LOW (ref 13.0–18.0)
MCH: 30.1 pg (ref 26.0–34.0)
MCHC: 34.1 g/dL (ref 32.0–36.0)
MCV: 88.1 fL (ref 80.0–100.0)
PLATELETS: 135 10*3/uL — AB (ref 150–440)
RBC: 2.25 MIL/uL — ABNORMAL LOW (ref 4.40–5.90)
RDW: 13.4 % (ref 11.5–14.5)
WBC: 6.8 10*3/uL (ref 3.8–10.6)

## 2017-03-15 LAB — CBC WITH DIFFERENTIAL/PLATELET
BASOS ABS: 0 10*3/uL (ref 0–0.1)
BASOS PCT: 0 %
Eosinophils Absolute: 0.1 10*3/uL (ref 0–0.7)
Eosinophils Relative: 1 %
HEMATOCRIT: 21.3 % — AB (ref 40.0–52.0)
HEMOGLOBIN: 7.2 g/dL — AB (ref 13.0–18.0)
LYMPHS PCT: 12 %
Lymphs Abs: 0.9 10*3/uL — ABNORMAL LOW (ref 1.0–3.6)
MCH: 29.7 pg (ref 26.0–34.0)
MCHC: 33.9 g/dL (ref 32.0–36.0)
MCV: 87.5 fL (ref 80.0–100.0)
MONO ABS: 0.6 10*3/uL (ref 0.2–1.0)
Monocytes Relative: 8 %
NEUTROS ABS: 6.4 10*3/uL (ref 1.4–6.5)
NEUTROS PCT: 79 %
Platelets: 152 10*3/uL (ref 150–440)
RBC: 2.44 MIL/uL — AB (ref 4.40–5.90)
RDW: 13.4 % (ref 11.5–14.5)
WBC: 8.1 10*3/uL (ref 3.8–10.6)

## 2017-03-15 LAB — BASIC METABOLIC PANEL
Anion gap: 3 — ABNORMAL LOW (ref 5–15)
BUN: 18 mg/dL (ref 6–20)
CALCIUM: 7.8 mg/dL — AB (ref 8.9–10.3)
CO2: 23 mmol/L (ref 22–32)
CREATININE: 1.2 mg/dL (ref 0.61–1.24)
Chloride: 114 mmol/L — ABNORMAL HIGH (ref 101–111)
GFR calc Af Amer: >60 mL/min (ref >60)
GFR calc non Af Amer: >60 mL/min (ref >60)
GLUCOSE: 113 mg/dL — AB (ref 65–99)
Potassium: 4 mmol/L (ref 3.5–5.1)
Sodium: 140 mmol/L (ref 135–145)

## 2017-03-15 LAB — HEMOGLOBIN
HEMOGLOBIN: 8.4 g/dL — AB (ref 13.0–18.0)
Hemoglobin: 8.1 g/dL — ABNORMAL LOW (ref 13.0–18.0)
Hemoglobin: 8.1 g/dL — ABNORMAL LOW (ref 13.0–18.0)

## 2017-03-15 LAB — PREPARE RBC (CROSSMATCH)

## 2017-03-15 LAB — ABO/RH: ABO/RH(D): A POS

## 2017-03-15 MED ORDER — PANTOPRAZOLE SODIUM 40 MG IV SOLR
40.0000 mg | Freq: Two times a day (BID) | INTRAVENOUS | Status: DC
  Administered 2017-03-15 – 2017-03-16 (×4): 40 mg via INTRAVENOUS
  Filled 2017-03-15 (×4): qty 40

## 2017-03-15 MED ORDER — ONDANSETRON HCL 4 MG PO TABS: 4.0000 mg | ORAL_TABLET | Freq: Four times a day (QID) | ORAL | Status: DC | PRN

## 2017-03-15 MED ORDER — DIPHENHYDRAMINE HCL 50 MG/ML IJ SOLN
25.0000 mg | Freq: Once | INTRAMUSCULAR | Status: AC
  Administered 2017-03-15: 25 mg via INTRAVENOUS
  Filled 2017-03-15: qty 1

## 2017-03-15 MED ORDER — SODIUM CHLORIDE 0.9 % IV SOLN
INTRAVENOUS | Status: DC
  Administered 2017-03-15: 21:00:00 via INTRAVENOUS

## 2017-03-15 MED ORDER — ACETAMINOPHEN 650 MG RE SUPP: 650.0000 mg | Freq: Four times a day (QID) | RECTAL | Status: DC | PRN

## 2017-03-15 MED ORDER — LORAZEPAM 2 MG/ML IJ SOLN
0.5000 mg | Freq: Once | INTRAMUSCULAR | Status: AC
Start: 2017-03-15 — End: 2017-03-15
  Administered 2017-03-15: 0.5 mg via INTRAVENOUS
  Filled 2017-03-15: qty 1

## 2017-03-15 MED ORDER — SODIUM CHLORIDE 0.9 % IV SOLN: Freq: Once | INTRAVENOUS | Status: DC

## 2017-03-15 MED ORDER — ACETAMINOPHEN 325 MG PO TABS: 650.0000 mg | ORAL_TABLET | Freq: Four times a day (QID) | ORAL | Status: DC | PRN

## 2017-03-15 MED ORDER — ONDANSETRON HCL 4 MG/2ML IJ SOLN: 4.0000 mg | Freq: Four times a day (QID) | INTRAMUSCULAR | Status: DC | PRN

## 2017-03-15 MED ORDER — PEG 3350-KCL-NA BICARB-NACL 420 G PO SOLR
4000.0000 mL | Freq: Once | ORAL | Status: AC
  Administered 2017-03-15: 4000 mL via ORAL
  Filled 2017-03-15: qty 4000

## 2017-03-15 MED ORDER — SODIUM CHLORIDE 0.9 % IV SOLN
INTRAVENOUS | Status: DC
  Administered 2017-03-16: 1000 mL via INTRAVENOUS

## 2017-03-15 MED ORDER — SODIUM CHLORIDE 0.9 % IV SOLN
INTRAVENOUS | Status: AC
  Administered 2017-03-15: 100 mL/h via INTRAVENOUS

## 2017-03-15 MED ORDER — SODIUM CHLORIDE 0.9 % IV BOLUS (SEPSIS)
1000.0000 mL | Freq: Once | INTRAVENOUS | Status: AC
  Administered 2017-03-15: 1000 mL via INTRAVENOUS

## 2017-03-15 MED ORDER — ACETAMINOPHEN 325 MG PO TABS
650.0000 mg | ORAL_TABLET | Freq: Once | ORAL | Status: AC
  Administered 2017-03-15: 650 mg via ORAL
  Filled 2017-03-15: qty 2

## 2017-03-15 NOTE — Telephone Encounter (Signed)
Made another attempt to contact patient to schedule an ED follow up with Dr. Allegra Lai.  No voicemail.  Letter sent

## 2017-03-15 NOTE — Consult Note (Signed)
Wyline Mood MD, MRCP(U.K) [address]  [address]  [place], [place] [address]  Main: [phone]  Fax: [phone]  Consultation  Referring Provider:   Dr Elisabeth Pigeon  Primary Care Physician:  Mick Sell, MD Primary Gastroenterologist:None           Reason for Consultation:     GI bleed   Date of Admission:  03/14/2017 Date of Consultation:  03/15/2017         HPI:   Darrell Freeman is a 61 y.o. male was admitted yesterday with multiple episodes of rectal bleeding . Hb was 14.2 grams on 10/26/16 . On admission yesterday Hb was 11.4 grams with MCV of 87 . Today has dropped to 6.8 . Grams. No elevation of BUN.   Last colonoscopy was in 2015  Which he recalls was normal. He does recall a prior history of a diverticular bleed. His brother had colon cancer .  He says was doing well till last Saturday when all of a sudden he had some lower abdominal cramping followed by bright red blood per rectum- large qty, subsided over the next few days , recurred yesterday , came to ER was told it had stopped , went home and recurred and came back in . He does mention long term use of Ibuprofen for his knees. Denies any abdominal pain or hematemesis. He has previously noted last week , dark almost black colored stool   CT scan of the abdomen Diverticulosis of the colon , enlarged mediastinal and peribroincial lymphnodes  Presently no complains. Comfortable. **    Past Medical History:  Diagnosis Date  . Arthritis   . Diverticulosis   . GERD (gastroesophageal reflux disease)   . GI bleed   . Hypertension     Past Surgical History:  Procedure Laterality Date  . OLECRANON BURSA EXCISION    . OLECRANON BURSECTOMY Left 12/26/2014   Procedure: OLECRANON BURSA;  Surgeon: Myra Rude, MD;  Location: [facility] ORS;  Service: Orthopedics;  Laterality: Left;    Prior to Admission medications   Medication Sig Start Date End Date Taking? Authorizing Provider  allopurinol (ZYLOPRIM) 300  MG tablet Take 300 mg by mouth as needed.   Yes [provider]  aspirin EC 325 MG tablet Take 1 tablet (325 mg total) by mouth daily. Patient taking differently: Take 81 mg by mouth daily.  10/27/15  Yes Rockne Menghini, MD  atorvastatin (LIPITOR) 10 MG tablet Take 1 tablet by mouth daily. 03/10/17  Yes [provider]  ibuprofen (ADVIL,MOTRIN) 200 MG tablet Take 200 mg by mouth every 6 (six) hours as needed.   Yes [provider]  losartan (COZAAR) 50 MG tablet Take 50 mg by mouth daily.    Yes [provider]  zolpidem (AMBIEN) 10 MG tablet Take 5-10 mg by mouth at bedtime as needed for sleep.   Yes [provider]  HYDROcodone-acetaminophen (NORCO) 5-325 MG per tablet Take 1 tablet by mouth every 6 (six) hours as needed for moderate pain. Patient not taking: Reported on 03/14/2017 12/26/14   Myra Rude, MD    No family history on file.   Social History  Substance Use Topics  . Smoking status: Former Smoker    Years: 7.00    Types: Cigarettes    Quit date: 12/11/1984  . Smokeless tobacco: Not on file  . Alcohol use Yes     Comment: OCC    Allergies as of 03/14/2017  . (No Known Allergies)  Review of Systems:    All systems reviewed and negative except where noted in HPI.   Physical Exam:  Vital signs in last 24 hours: Temp:  [97.6 F (36.4 C)-99.7 F (37.6 C)] 98.3 F (36.8 C) (10/10 0917) Pulse Rate:  [73-106] 78 (10/10 0917) Resp:  [16-20] 16 (10/10 0917) BP: (105-150)/(55-101) 109/73 (10/10 0917) SpO2:  [95 %-100 %] 100 % (10/10 0917) Weight:  [178 lb (80.7 kg)-188 lb 12.8 oz (85.6 kg)] 188 lb 12.8 oz (85.6 kg) (10/10 0232) Last BM Date: 03/15/17 General:   Pleasant, cooperative in NAD Head:  Normocephalic and atraumatic. Eyes:   No icterus.   Conjunctiva pink. PERRLA. Ears:  Normal auditory acuity. Neck:  Supple; no masses or thyroidomegaly Lungs: Respirations even and unlabored. Lungs clear to auscultation  bilaterally.   No wheezes, crackles, or rhonchi.  Heart:  Regular rate and rhythm;  Without murmur, clicks, rubs or gallops Abdomen:  Soft, nondistended, nontender. Normal bowel sounds. No appreciable masses or hepatomegaly.  No rebound or guarding.  Neurologic:  Alert and oriented x3;  grossly normal neurologically. Skin:  Intact without significant lesions or rashes. Cervical Nodes:  No significant cervical adenopathy. Psych:  Alert and cooperative. Normal affect.  LAB RESULTS:  Recent Labs  03/14/17 2313 03/15/17 0150 03/15/17 0524  WBC 6.1 8.1 6.8  HGB 9.6* 7.2* 6.8*  HCT 27.6* 21.3* 19.8*  PLT 197 152 135*   BMET  Recent Labs  03/14/17 1206 03/14/17 2313 03/15/17 0524  NA 138 140 140  K 3.9 3.6 4.0  CL 107 107 114*  CO2 GLUCOSE 117* 156* 113*  BUN CREATININE 1.30* 1.41* 1.20  CALCIUM 9.5 8.9 7.8*   LFT  Recent Labs  03/14/17 2313  PROT 6.3*  ALBUMIN 3.5  AST 21  ALT 19  ALKPHOS 57  BILITOT 0.7   PT/INR  Recent Labs  03/14/17 1206  LABPROT 12.7  INR 0.96    STUDIES: Ct Abdomen Pelvis W Contrast  Result Date: 03/15/2017 CLINICAL DATA:  Acute onset of rectal bleeding. Diaphoresis. Initial encounter. EXAM: CT ABDOMEN AND PELVIS WITH CONTRAST TECHNIQUE: Multidetector CT imaging of the abdomen and pelvis was performed using the standard protocol following bolus administration of intravenous contrast. CONTRAST:  ISOVUE-300 IOPAMIDOL (ISOVUE-300) INJECTION 61% COMPARISON:  None. FINDINGS: Lower chest: Enlarged mediastinal and bilateral peribronchial nodes are seen, measuring up to 1.9 cm at the right peribronchial region, and up to 1.3 cm at the azygoesophageal recess. Underlying calcified right hilar nodes are suspected. The distribution would be somewhat unusual for lymphoma. Given that no additional mass is seen on this study, metastatic disease from primary bronchogenic malignancy cannot be excluded. Alternatively, systemic  inflammatory conditions might have a similar appearance. CT of the chest would be helpful for further evaluation, when and as deemed clinically appropriate. Minimal scarring is noted at the lung bases. Hepatobiliary: The liver is unremarkable in appearance. The gallbladder is unremarkable in appearance. The common bile duct remains normal in caliber. Pancreas: The pancreas is within normal limits. Spleen: The spleen is unremarkable in appearance. Adrenals/Urinary Tract: The adrenal glands are unremarkable in appearance. Scattered nonobstructing bilateral renal stones are seen, measuring up to 4 mm in size. There is no evidence of hydronephrosis. No obstructing ureteral stones are identified. No perinephric stranding is appreciated. Stomach/Bowel: The appendix is slightly distended, measuring up to 1.2 cm in diameter, but no associated soft tissue inflammation is seen to suggest appendicitis. Scattered diverticulosis is  noted along the entirety of the colon, without definite evidence of diverticulitis. The small bowel is grossly unremarkable in appearance. The stomach is grossly unremarkable. Vascular/Lymphatic: The abdominal aorta is unremarkable in appearance. The inferior vena cava is grossly unremarkable. No retroperitoneal lymphadenopathy is seen. No pelvic sidewall lymphadenopathy is identified. Reproductive: The bladder is mildly distended and grossly unremarkable. The prostate is normal in size. Other: No additional soft tissue abnormalities are seen. Musculoskeletal: No acute osseous abnormalities are identified. Vacuum phenomenon is noted along the lower thoracic and lower lumbar spine. The visualized musculature is unremarkable in appearance. IMPRESSION: 1. Enlarged mediastinal and bilateral peribronchial nodes, measuring up to 1.9 cm at the right peribronchial region. Underlying suspected calcified right hilar nodes. The distribution would be somewhat unusual for lymphoma. Given the lack of additional  mass on this study, metastatic disease from primary bronchogenic malignancy cannot be excluded. Alternately, systemic inflammatory conditions might have a similar appearance. CT of the chest would be helpful for further evaluation, when and as deemed clinically appropriate. 2. Scattered diverticulosis along the entirety of the colon, without definite evidence of diverticulitis. This may correspond to the patient's rectal bleeding. 3. Scattered nonobstructing bilateral renal stones measure up to 4 mm in size. 4. Distention of the appendix is thought to reflect the patient's baseline, given the lack of associated soft tissue inflammation. No definite evidence for appendicitis. Electronically Signed   By: Roanna Raider M.D.   On: 03/15/2017 00:20      Impression / Plan:   Darrell Freeman is a 61 y.o. y/o male with severe GI bleed. Appears a lower GI bleed. Likely diverticular. Worsened by long tern NSAID use Cannot rule out an upper GI source  Plan  1. EGD+colonoscopy tomorrow 2. IV PPI 3. No NSAID;s 4. Would also need follow up with pulmonary for peribronchial lymphadenopathy 5 H pylori stool antigen  6. Monitor and transfuse per CBC.    I have discussed alternative options, risks & benefits,  which include, but are not limited to, bleeding, infection, perforation,respiratory complication & drug reaction.  The patient agrees with this plan & written consent will be obtained.     Thank you for involving me in the care of this patient.      LOS: 0 days   Wyline Mood, MD  03/15/2017, 9:33 AM

## 2017-03-15 NOTE — Progress Notes (Signed)
[employer] [facility] [place] [facility]   PATIENT NAME: Darrell Freeman    MR#:  [id number]  DATE OF BIRTH:  May 16, 1956  SUBJECTIVE:  CHIEF COMPLAINT:   Chief Complaint  Patient presents with  . Rectal Bleeding     Came with Rectal bleeding and Low Hb, received Blood transfusion, No BM since morning- Until I had seen around 11 am. No pain. No nausea.  REVIEW OF SYSTEMS:  CONSTITUTIONAL: No fever, fatigue or weakness.  EYES: No blurred or double vision.  EARS, NOSE, AND THROAT: No tinnitus or ear pain.  RESPIRATORY: No cough, shortness of breath, wheezing or hemoptysis.  CARDIOVASCULAR: No chest pain, orthopnea, edema.  GASTROINTESTINAL: No nausea, vomiting, diarrhea or abdominal pain. Have dark stool. GENITOURINARY: No dysuria, hematuria.  ENDOCRINE: No polyuria, nocturia,  HEMATOLOGY: No anemia, easy bruising or bleeding SKIN: No rash or lesion. MUSCULOSKELETAL: No joint pain or arthritis.   NEUROLOGIC: No tingling, numbness, weakness.  PSYCHIATRY: No anxiety or depression.   ROS  DRUG ALLERGIES:  No Known Allergies  VITALS:  Blood pressure 121/80, pulse 80, temperature 98.2 F (36.8 C), temperature source Oral, resp. rate 16, height  (1.651 m), weight 85.6 kg (188 lb 12.8 oz), SpO2 100 %.  PHYSICAL EXAMINATION:  GENERAL:  61 y.o.-year-old patient lying in the bed with no acute distress.  EYES: Pupils equal, round, reactive to light and accommodation. No scleral icterus. Extraocular muscles intact. Conjunctiva pale. HEENT: Head atraumatic, normocephalic. Oropharynx and nasopharynx clear.  NECK:  Supple, no jugular venous distention. No thyroid enlargement, no tenderness.  LUNGS: Normal breath sounds bilaterally, no wheezing, rales,rhonchi or crepitation. No use of accessory muscles of respiration.  CARDIOVASCULAR: S1, S2 normal. No murmurs, rubs, or gallops.  ABDOMEN: Soft, nontender, nondistended. Bowel sounds present. No organomegaly or mass.   EXTREMITIES: No pedal edema, cyanosis, or clubbing.  NEUROLOGIC: Cranial nerves II through XII are intact. Muscle strength 5/5 in all extremities. Sensation intact. Gait not checked.  PSYCHIATRIC: The patient is alert and oriented x 3.  SKIN: No obvious rash, lesion, or ulcer.   Physical Exam LABORATORY PANEL:   CBC  Recent Labs Lab 03/15/17 0524  03/15/17 1704  WBC 6.8  --   --   HGB 6.8*  < > 8.4*  HCT 19.8*  --   --   PLT 135*  --   --   < > = values in this interval not displayed. ------------------------------------------------------------------------------------------------------------------  Chemistries   Recent Labs Lab 03/14/17 2313 03/15/17 0524  NA 140 140  K 3.6 4.0  CL 107 114*  CO2 22 23  GLUCOSE 156* 113*  BUN 19 18  CREATININE 1.41* 1.20  CALCIUM 8.9 7.8*  AST 21  --   ALT 19  --   ALKPHOS 57  --   BILITOT 0.7  --    ------------------------------------------------------------------------------------------------------------------  Cardiac Enzymes No results for input(s): TROPONINI in the last 168 hours. ------------------------------------------------------------------------------------------------------------------  RADIOLOGY:  Ct Abdomen Pelvis W Contrast  Result Date: 03/15/2017 CLINICAL DATA:  Acute onset of rectal bleeding. Diaphoresis. Initial encounter. EXAM: CT ABDOMEN AND PELVIS WITH CONTRAST TECHNIQUE: Multidetector CT imaging of the abdomen and pelvis was performed using the standard protocol following bolus administration of intravenous contrast. CONTRAST:  ISOVUE-300 IOPAMIDOL (ISOVUE-300) INJECTION 61% COMPARISON:  None. FINDINGS: Lower chest: Enlarged mediastinal and bilateral peribronchial nodes are seen, measuring up to 1.9 cm at the right peribronchial region, and up to 1.3 cm at the azygoesophageal recess. Underlying calcified right hilar  nodes are suspected. The distribution would be somewhat unusual for lymphoma. Given  that no additional mass is seen on this study, metastatic disease from primary bronchogenic malignancy cannot be excluded. Alternatively, systemic inflammatory conditions might have a similar appearance. CT of the chest would be helpful for further evaluation, when and as deemed clinically appropriate. Minimal scarring is noted at the lung bases. Hepatobiliary: The liver is unremarkable in appearance. The gallbladder is unremarkable in appearance. The common bile duct remains normal in caliber. Pancreas: The pancreas is within normal limits. Spleen: The spleen is unremarkable in appearance. Adrenals/Urinary Tract: The adrenal glands are unremarkable in appearance. Scattered nonobstructing bilateral renal stones are seen, measuring up to 4 mm in size. There is no evidence of hydronephrosis. No obstructing ureteral stones are identified. No perinephric stranding is appreciated. Stomach/Bowel: The appendix is slightly distended, measuring up to 1.2 cm in diameter, but no associated soft tissue inflammation is seen to suggest appendicitis. Scattered diverticulosis is noted along the entirety of the colon, without definite evidence of diverticulitis. The small bowel is grossly unremarkable in appearance. The stomach is grossly unremarkable. Vascular/Lymphatic: The abdominal aorta is unremarkable in appearance. The inferior vena cava is grossly unremarkable. No retroperitoneal lymphadenopathy is seen. No pelvic sidewall lymphadenopathy is identified. Reproductive: The bladder is mildly distended and grossly unremarkable. The prostate is normal in size. Other: No additional soft tissue abnormalities are seen. Musculoskeletal: No acute osseous abnormalities are identified. Vacuum phenomenon is noted along the lower thoracic and lower lumbar spine. The visualized musculature is unremarkable in appearance. IMPRESSION: 1. Enlarged mediastinal and bilateral peribronchial nodes, measuring up to 1.9 cm at the right peribronchial  region. Underlying suspected calcified right hilar nodes. The distribution would be somewhat unusual for lymphoma. Given the lack of additional mass on this study, metastatic disease from primary bronchogenic malignancy cannot be excluded. Alternately, systemic inflammatory conditions might have a similar appearance. CT of the chest would be helpful for further evaluation, when and as deemed clinically appropriate. 2. Scattered diverticulosis along the entirety of the colon, without definite evidence of diverticulitis. This may correspond to the patient's rectal bleeding. 3. Scattered nonobstructing bilateral renal stones measure up to 4 mm in size. 4. Distention of the appendix is thought to reflect the patient's baseline, given the lack of associated soft tissue inflammation. No definite evidence for appendicitis. Electronically Signed   By: Roanna Raider M.D.   On: 03/15/2017 00:20    ASSESSMENT AND PLAN:   Principal Problem:   GI bleed Active Problems:   HTN (hypertension)   GERD (gastroesophageal reflux disease)   * GI bleed - serial hemoglobin checks, IV fluids, GI consult.  Suspect lower GI bleed, possibly diverticular bleed given diverticulosis seen on CT scan  Pt also have use of Ibuprofen and aspirin daily.  * Anemia due to blood loss.   Transfused 2 units of PRBC, monitor frequently.  *  HTN (hypertension) - hold antihypertensives for now as patient's blood pressure is low and normotensive *  GERD (gastroesophageal reflux disease) - PPI    All the records are reviewed and case discussed with Care Management/Social Workerr. Management plans discussed with the patient, family and they are in agreement.  CODE STATUS: Full.  TOTAL TIME TAKING CARE OF THIS PATIENT: 35 minutes.   Spoke to his wife in room.  POSSIBLE D/C IN 1-2 DAYS, DEPENDING ON CLINICAL CONDITION.   Altamese Dilling M.D on 03/15/2017   Between 7am to 6pm - Pager - [phone]  After 6pm  go to  [email or web address] - password [email or web address]  [facility]  Office  [phone]  CC: Primary care physician; Mick Sell, MD  Note: This dictation was prepared with Dragon dictation along with smaller phrase technology. Any transcriptional errors that result from this process are unintentional.

## 2017-03-15 NOTE — Progress Notes (Signed)
Dr Elisabeth Pigeon requested that lab wait to draw next hgb once blood transfusions are complete

## 2017-03-15 NOTE — H&P (Signed)
[facility] - [place] at [facility]   PATIENT NAME: Darrell Freeman    MR#:  [id number]  DATE OF BIRTH:  [birth year]  DATE OF ADMISSION:  03/14/2017  PRIMARY CARE PHYSICIAN: Mick Sell, MD   REQUESTING/REFERRING PHYSICIAN: Manson Passey, MD  CHIEF COMPLAINT:   Chief Complaint  Patient presents with  . Rectal Bleeding    HISTORY OF PRESENT ILLNESS:  Darrell Freeman  is a 61 y.o. male who presents with Several episodes of bright red blood per rectum. Patient denies any abdominal pain, states that he first had some blood in his stool a couple of days ago. He went to clinic today to be worked up and they sent him over here to the ED after finding that his hemoglobin had dropped several points. He was initially sent home from the ED because he had not had recurrent bleeding. However when he got home tonight he had several more episodes of bloody stool and came back. His hemoglobin had dropped further and hospitalists were called for admission  PAST MEDICAL HISTORY:   Past Medical History:  Diagnosis Date  . Arthritis   . Diverticulosis   . GERD (gastroesophageal reflux disease)   . GI bleed   . Hypertension     PAST SURGICAL HISTORY:   Past Surgical History:  Procedure Laterality Date  . OLECRANON BURSA EXCISION    . OLECRANON BURSECTOMY Left 12/26/2014   Procedure: OLECRANON BURSA;  Surgeon: Myra Rude, MD;  Location: [facility] ORS;  Service: Orthopedics;  Laterality: Left;    SOCIAL HISTORY:   Social History  Substance Use Topics  . Smoking status: Former Smoker    Years: 7.00    Types: Cigarettes    Quit date: 12/11/1984  . Smokeless tobacco: Not on file  . Alcohol use Yes     Comment: OCC    FAMILY HISTORY:  No family history on file.  DRUG ALLERGIES:  No Known Allergies  MEDICATIONS AT HOME:   Prior to Admission medications   Medication Sig Start Date End Date Taking? Authorizing Provider  allopurinol (ZYLOPRIM) 300 MG tablet  Take 300 mg by mouth as needed.   Yes [provider]  aspirin EC 325 MG tablet Take 1 tablet (325 mg total) by mouth daily. Patient taking differently: Take 81 mg by mouth daily.  10/27/15  Yes Rockne Menghini, MD  atorvastatin (LIPITOR) 10 MG tablet Take 1 tablet by mouth daily. 03/10/17  Yes [provider]  ibuprofen (ADVIL,MOTRIN) 200 MG tablet Take 200 mg by mouth every 6 (six) hours as needed.   Yes [provider]  losartan (COZAAR) 50 MG tablet Take 50 mg by mouth daily.    Yes [provider]  zolpidem (AMBIEN) 10 MG tablet Take 5-10 mg by mouth at bedtime as needed for sleep.   Yes [provider]  HYDROcodone-acetaminophen (NORCO) 5-325 MG per tablet Take 1 tablet by mouth every 6 (six) hours as needed for moderate pain. Patient not taking: Reported on 03/14/2017 12/26/14   Myra Rude, MD    REVIEW OF SYSTEMS:  Review of Systems  Constitutional: Negative for chills, fever, malaise/fatigue and weight loss.  HENT: Negative for ear pain, hearing loss and tinnitus.   Eyes: Negative for blurred vision, double vision, pain and redness.  Respiratory: Negative for cough, hemoptysis and shortness of breath.   Cardiovascular: Negative for chest pain, palpitations, orthopnea and leg swelling.  Gastrointestinal: Positive for blood in stool. Negative for abdominal pain, constipation, diarrhea, nausea  and vomiting.  Genitourinary: Negative for dysuria, frequency and hematuria.  Musculoskeletal: Negative for back pain, joint pain and neck pain.  Skin:       No acne, rash, or lesions  Neurological: Negative for dizziness, tremors, focal weakness and weakness.  Endo/Heme/Allergies: Negative for polydipsia. Does not bruise/bleed easily.  Psychiatric/Behavioral: Negative for depression. The patient is not nervous/anxious and does not have insomnia.      VITAL SIGNS:   Vitals:   03/14/17 2314 03/14/17 2330 03/15/17 0025 03/15/17 0100  BP:   (!) 150/91 137/79 129/82  Pulse:  (!) 103 87 90  Resp:  Temp:      TempSrc:      SpO2:  100% 95% 98%  Weight: 80.7 kg (178 lb)     Height:  (1.651 m)      Wt Readings from Last 3 Encounters:  03/14/17 80.7 kg (178 lb)  03/14/17 80.7 kg (178 lb)  10/27/15 84.1 kg (185 lb 6.4 oz)    PHYSICAL EXAMINATION:  Physical Exam  Vitals reviewed. Constitutional: He is oriented to person, place, and time. He appears well-developed and well-nourished. No distress.  HENT:  Head: Normocephalic and atraumatic.  Mouth/Throat: Oropharynx is clear and moist.  Eyes: Pupils are equal, round, and reactive to light. Conjunctivae and EOM are normal. No scleral icterus.  Neck: Normal range of motion. Neck supple. No JVD present. No thyromegaly present.  Cardiovascular: Normal rate, regular rhythm and intact distal pulses.  Exam reveals no gallop and no friction rub.   No murmur heard. Respiratory: Effort normal and breath sounds normal. No respiratory distress. He has no wheezes. He has no rales.  GI: Soft. Bowel sounds are normal. He exhibits no distension. There is no tenderness.  Musculoskeletal: Normal range of motion. He exhibits no edema.  No arthritis, no gout  Lymphadenopathy:    He has no cervical adenopathy.  Neurological: He is alert and oriented to person, place, and time. No cranial nerve deficit.  No dysarthria, no aphasia  Skin: Skin is warm and dry. No rash noted. No erythema.  Psychiatric: He has a normal mood and affect. His behavior is normal. Judgment and thought content normal.    LABORATORY PANEL:   CBC  Recent Labs Lab 03/14/17 2313  WBC 6.1  HGB 9.6*  HCT 27.6*  PLT 197   ------------------------------------------------------------------------------------------------------------------  Chemistries   Recent Labs Lab 03/14/17 2313  NA 140  K 3.6  CL 107  CO2 22  GLUCOSE 156*  BUN 19  CREATININE 1.41*  CALCIUM 8.9  AST 21  ALT 19  ALKPHOS  57  BILITOT 0.7   ------------------------------------------------------------------------------------------------------------------  Cardiac Enzymes No results for input(s): TROPONINI in the last 168 hours. ------------------------------------------------------------------------------------------------------------------  RADIOLOGY:  Ct Abdomen Pelvis W Contrast  Result Date: 03/15/2017 CLINICAL DATA:  Acute onset of rectal bleeding. Diaphoresis. Initial encounter. EXAM: CT ABDOMEN AND PELVIS WITH CONTRAST TECHNIQUE: Multidetector CT imaging of the abdomen and pelvis was performed using the standard protocol following bolus administration of intravenous contrast. CONTRAST:  ISOVUE-300 IOPAMIDOL (ISOVUE-300) INJECTION 61% COMPARISON:  None. FINDINGS: Lower chest: Enlarged mediastinal and bilateral peribronchial nodes are seen, measuring up to 1.9 cm at the right peribronchial region, and up to 1.3 cm at the azygoesophageal recess. Underlying calcified right hilar nodes are suspected. The distribution would be somewhat unusual for lymphoma. Given that no additional mass is seen on this study, metastatic disease from primary bronchogenic malignancy cannot be excluded. Alternatively, systemic inflammatory  conditions might have a similar appearance. CT of the chest would be helpful for further evaluation, when and as deemed clinically appropriate. Minimal scarring is noted at the lung bases. Hepatobiliary: The liver is unremarkable in appearance. The gallbladder is unremarkable in appearance. The common bile duct remains normal in caliber. Pancreas: The pancreas is within normal limits. Spleen: The spleen is unremarkable in appearance. Adrenals/Urinary Tract: The adrenal glands are unremarkable in appearance. Scattered nonobstructing bilateral renal stones are seen, measuring up to 4 mm in size. There is no evidence of hydronephrosis. No obstructing ureteral stones are identified. No perinephric  stranding is appreciated. Stomach/Bowel: The appendix is slightly distended, measuring up to 1.2 cm in diameter, but no associated soft tissue inflammation is seen to suggest appendicitis. Scattered diverticulosis is noted along the entirety of the colon, without definite evidence of diverticulitis. The small bowel is grossly unremarkable in appearance. The stomach is grossly unremarkable. Vascular/Lymphatic: The abdominal aorta is unremarkable in appearance. The inferior vena cava is grossly unremarkable. No retroperitoneal lymphadenopathy is seen. No pelvic sidewall lymphadenopathy is identified. Reproductive: The bladder is mildly distended and grossly unremarkable. The prostate is normal in size. Other: No additional soft tissue abnormalities are seen. Musculoskeletal: No acute osseous abnormalities are identified. Vacuum phenomenon is noted along the lower thoracic and lower lumbar spine. The visualized musculature is unremarkable in appearance. IMPRESSION: 1. Enlarged mediastinal and bilateral peribronchial nodes, measuring up to 1.9 cm at the right peribronchial region. Underlying suspected calcified right hilar nodes. The distribution would be somewhat unusual for lymphoma. Given the lack of additional mass on this study, metastatic disease from primary bronchogenic malignancy cannot be excluded. Alternately, systemic inflammatory conditions might have a similar appearance. CT of the chest would be helpful for further evaluation, when and as deemed clinically appropriate. 2. Scattered diverticulosis along the entirety of the colon, without definite evidence of diverticulitis. This may correspond to the patient's rectal bleeding. 3. Scattered nonobstructing bilateral renal stones measure up to 4 mm in size. 4. Distention of the appendix is thought to reflect the patient's baseline, given the lack of associated soft tissue inflammation. No definite evidence for appendicitis. Electronically Signed   By: Roanna Raider M.D.   On: 03/15/2017 00:20    EKG:   Orders placed or performed during the hospital encounter of 10/27/15  . EKG 12-Lead  . EKG 12-Lead  . ED EKG  . ED EKG  . EKG    IMPRESSION AND PLAN:  Principal Problem:   GI bleed - serial hemoglobin checks, IV fluids, GI consult. Suspect lower GI bleed, possibly diverticular bleed given diverticulosis seen on CT scan Active Problems:   HTN (hypertension) - hold antihypertensives for now as patient's blood pressure is low and normotensive   GERD (gastroesophageal reflux disease) - PPI  All the records are reviewed and case discussed with ED provider. Management plans discussed with the patient and/or family.  DVT PROPHYLAXIS: Mechanical only  GI PROPHYLAXIS: PPI  ADMISSION STATUS: Inpatient  CODE STATUS: Full Code Status History    This patient does not have a recorded code status. Please follow your organizational policy for patients in this situation.      TOTAL TIME TAKING CARE OF THIS PATIENT: 45 minutes.   Masaki Rothbauer FIELDING 03/15/2017, 1:30 AM  [facility]  Office  [phone]  CC: Primary care physician; Mick Sell, MD  Note:  This document was prepared using Dragon voice recognition software and may include unintentional dictation errors.

## 2017-03-15 NOTE — ED Notes (Signed)
Pt had episode of bright red bloody stool.

## 2017-03-15 NOTE — Telephone Encounter (Signed)
Attempted to call patient and schedule an ED follow up with Dr. Allegra Lai. No voice mail no answer

## 2017-03-15 NOTE — Progress Notes (Signed)
IVF expired.  Pt will be NPO after midnight.  Dr Elisabeth Pigeon said to continue NS at /hr

## 2017-03-15 NOTE — Progress Notes (Signed)
Patient requested to discuss transfusion with physician prior to administration.  Dr. Sheryle Hail to discuss with patient.

## 2017-03-15 NOTE — Progress Notes (Signed)
Patient's repeat home hemoglobin is down to 7. Discussed with him earlier the fact that we may very well have to pursue transfusion if his hemoglobin drop to this level, patient was willing to have this done. Transfusion orders placed.  Kristeen Miss Lowery [facility] 03/15/2017, 3:39 AM

## 2017-03-15 NOTE — ED Notes (Addendum)
Pt assisted to toliet in room. Pt had 2 episodes of bloody diarrhea. MD made aware

## 2017-03-16 ENCOUNTER — Encounter
Admission: EM | Disposition: A | Discharge status: Home / Self Care | Payer: Self-pay | Source: Home / Self Care | Attending: Internal Medicine

## 2017-03-16 ENCOUNTER — Inpatient Hospital Stay: Payer: 59 | Admitting: Anesthesiology

## 2017-03-16 ENCOUNTER — Encounter: Payer: Self-pay | Admitting: *Deleted

## 2017-03-16 DIAGNOSIS — K222 Esophageal obstruction: Secondary | ICD-10-CM

## 2017-03-16 DIAGNOSIS — K640 First degree hemorrhoids: Secondary | ICD-10-CM

## 2017-03-16 DIAGNOSIS — K209 Esophagitis, unspecified: Secondary | ICD-10-CM

## 2017-03-16 DIAGNOSIS — K5730 Diverticulosis of large intestine without perforation or abscess without bleeding: Secondary | ICD-10-CM

## 2017-03-16 HISTORY — PX: COLONOSCOPY WITH PROPOFOL: SHX5780

## 2017-03-16 HISTORY — PX: ESOPHAGOGASTRODUODENOSCOPY (EGD) WITH PROPOFOL: SHX5813

## 2017-03-16 LAB — TYPE AND SCREEN
ABO/RH(D): A POS
ANTIBODY SCREEN: NEGATIVE
UNIT DIVISION: 0
UNIT DIVISION: 0
Unit division: 0

## 2017-03-16 LAB — BPAM RBC
BLOOD PRODUCT EXPIRATION DATE: 201810162359
BLOOD PRODUCT EXPIRATION DATE: 201810162359
Blood Product Expiration Date: 201810222359
ISSUE DATE / TIME: 201810100636
ISSUE DATE / TIME: 201810101033
ISSUE DATE / TIME: 201810101410
Unit Type and Rh: 6200
Unit Type and Rh: 6200
Unit Type and Rh: 6200

## 2017-03-16 LAB — HEMOGLOBIN
HEMOGLOBIN: 8.2 g/dL — AB (ref 13.0–18.0)
Hemoglobin: 8 g/dL — ABNORMAL LOW (ref 13.0–18.0)
Hemoglobin: 8 g/dL — ABNORMAL LOW (ref 13.0–18.0)

## 2017-03-16 LAB — HIV ANTIBODY (ROUTINE TESTING W REFLEX): HIV SCREEN 4TH GENERATION: NONREACTIVE

## 2017-03-16 SURGERY — ESOPHAGOGASTRODUODENOSCOPY (EGD) WITH PROPOFOL
Anesthesia: General

## 2017-03-16 MED ORDER — GLYCOPYRROLATE 0.2 MG/ML IJ SOLN
INTRAMUSCULAR | Status: DC | PRN
  Administered 2017-03-16: 0.2 mg via INTRAVENOUS

## 2017-03-16 MED ORDER — PROPOFOL 500 MG/50ML IV EMUL
INTRAVENOUS | Status: AC
  Filled 2017-03-16: qty 50

## 2017-03-16 MED ORDER — PROPOFOL 10 MG/ML IV BOLUS
INTRAVENOUS | Status: DC | PRN
  Administered 2017-03-16: 20 mg via INTRAVENOUS
  Administered 2017-03-16: 80 mg via INTRAVENOUS

## 2017-03-16 MED ORDER — GLYCOPYRROLATE 0.2 MG/ML IJ SOLN
INTRAMUSCULAR | Status: AC
  Filled 2017-03-16: qty 1

## 2017-03-16 MED ORDER — LIDOCAINE HCL (PF) 2 % IJ SOLN
INTRAMUSCULAR | Status: AC
  Filled 2017-03-16: qty 10

## 2017-03-16 MED ORDER — PROPOFOL 500 MG/50ML IV EMUL
INTRAVENOUS | Status: DC | PRN
  Administered 2017-03-16: 200 ug/kg/min via INTRAVENOUS

## 2017-03-16 MED ORDER — LIDOCAINE 2% (20 MG/ML) 5 ML SYRINGE
INTRAMUSCULAR | Status: DC | PRN
Start: 2017-03-16 — End: 2017-03-16
  Administered 2017-03-16: 100 mg via INTRAVENOUS

## 2017-03-16 MED ORDER — SODIUM CHLORIDE 0.9 % IV SOLN: INTRAVENOUS | Status: DC

## 2017-03-16 MED ORDER — PANTOPRAZOLE SODIUM 40 MG PO TBEC: 40.0000 mg | DELAYED_RELEASE_TABLET | Freq: Two times a day (BID) | ORAL | 1 refills | Status: DC

## 2017-03-16 NOTE — Discharge Summary (Signed)
[facility] - [place] at [facility]   PATIENT NAME: Darrell Freeman    MR#:  [id number]  DATE OF BIRTH:  1955-09-21  DATE OF ADMISSION:  03/14/2017 ADMITTING PHYSICIAN: Oralia Manis, MD  DATE OF DISCHARGE: 03/16/2017   PRIMARY CARE PHYSICIAN: Mick Sell, MD    ADMISSION DIAGNOSIS:  rectal bleeding gi bleed  DISCHARGE DIAGNOSIS:  Principal Problem:   GI bleed Active Problems:   HTN (hypertension)   GERD (gastroesophageal reflux disease)   SECONDARY DIAGNOSIS:   Past Medical History:  Diagnosis Date  . Arthritis   . Diverticulosis   . GERD (gastroesophageal reflux disease)   . GI bleed   . Hypertension     HOSPITAL COURSE:   *GI bleed - serial hemoglobin checks, IV fluids, GI consult.  Suspect lower GI bleed, possibly diverticular bleed given diverticulosis seen on CT scan  Pt also have use of Ibuprofen and aspirin daily.   Colonoscopy and endoscopy is done.  Endoscopy   Impression:           - Normal examined duodenum.                       - Small hiatal hernia.                       - LA Grade A reflux esophagitis.                       - Widely patent Schatzki ring.                       - The examination was otherwise normal.                       - No specimens collected. Recommendation:       - Perform a colonoscopy today  Colonoscopy  Impression:           - Preparation of the colon was fair.                       - Non-bleeding internal hemorrhoids.                       - Diverticulosis in the sigmoid colon.                       - Stool in the entire examined colon.                       - No specimens collected. Recommendation:       - Return patient to hospital ward for ongoing care.                       - Advance diet as tolerated.                       - Continue present medications.                       - 1. Prep inadequate for polyp detection                       2. No blood seen in the colon, stomach  or the terminal  ileum. Bleeding has stopped.                       3. Likely cause of bleed is diverticular                       4. Advance diet and can be discharged from GI pointof                        view                       5. Check CBC with PCP in 5-7 days                       6. No NSAID's   * Anemia due to blood loss.   Transfused 2 units of PRBC, monitor frequently.  *HTN (hypertension) - hold antihypertensives for now as patient's blood pressure is low and normotensive *GERD (gastroesophageal reflux disease) - PPI  DISCHARGE CONDITIONS:   Stable.  CONSULTS OBTAINED:    DRUG ALLERGIES:  No Known Allergies  DISCHARGE MEDICATIONS:   Current Discharge Medication List    START taking these medications   Details  pantoprazole (PROTONIX) 40 MG tablet Take 1 tablet (40 mg total) by mouth 2 (two) times daily. Qty: 60 tablet, Refills: 1      CONTINUE these medications which have NOT CHANGED   Details  allopurinol (ZYLOPRIM) 300 MG tablet Take 300 mg by mouth as needed.    atorvastatin (LIPITOR) 10 MG tablet Take 1 tablet by mouth daily.    losartan (COZAAR) 50 MG tablet Take 50 mg by mouth daily.     zolpidem (AMBIEN) 10 MG tablet Take 5-10 mg by mouth at bedtime as needed for sleep.    HYDROcodone-acetaminophen (NORCO) 5-325 MG per tablet Take 1 tablet by mouth every 6 (six) hours as needed for moderate pain. Qty: 30 tablet, Refills: 0      STOP taking these medications     aspirin EC 325 MG tablet      ibuprofen (ADVIL,MOTRIN) 200 MG tablet          DISCHARGE INSTRUCTIONS:     Follow with PMD    No NSAIDs use.  Follow with GI clinic.  If you experience worsening of your admission symptoms, develop shortness of breath, life threatening emergency, suicidal or homicidal thoughts you must seek medical attention immediately by calling 911 or calling your MD immediately  if symptoms less severe.  You Must read complete  instructions/literature along with all the possible adverse reactions/side effects for all the Medicines you take and that have been prescribed to you. Take any new Medicines after you have completely understood and accept all the possible adverse reactions/side effects.   Please note  You were cared for by a hospitalist during your hospital stay. If you have any questions about your discharge medications or the care you received while you were in the hospital after you are discharged, you can call the unit and asked to speak with the hospitalist on call if the hospitalist that took care of you is not available. Once you are discharged, your primary care physician will handle any further medical issues. Please note that NO REFILLS for any discharge medications will be authorized once you are discharged, as it is imperative that you return to your primary care physician (or establish a relationship with  a primary care physician if you do not have one) for your aftercare needs so that they can reassess your need for medications and monitor your lab values.    Today   CHIEF COMPLAINT:   Chief Complaint  Patient presents with  . Rectal Bleeding    HISTORY OF PRESENT ILLNESS:  Darrell Freeman  is a 61 y.o. male presents with Several episodes of bright red blood per rectum. Patient denies any abdominal pain, states that he first had some blood in his stool a couple of days ago. He went to clinic today to be worked up and they sent him over here to the ED after finding that his hemoglobin had dropped several points. He was initially sent home from the ED because he had not had recurrent bleeding. However when he got home tonight he had several more episodes of bloody stool and came back. His hemoglobin had dropped further and hospitalists were called for admission    VITAL SIGNS:  Blood pressure 106/75, pulse 91, temperature 99.1 F (37.3 C), temperature source Tympanic, resp. rate 16, height   (1.651 m), weight 85.6 kg (188 lb 12.8 oz), SpO2 96 %.  I/O:   Intake/Output Summary (Last 24 hours) at 03/16/17 1635 Last data filed at 03/16/17 1500  Gross per 24 hour  Intake          3118.33 ml  Output                0 ml  Net          3118.33 ml    PHYSICAL EXAMINATION:  GENERAL:  61 y.o.-year-old patient lying in the bed with no acute distress.  EYES: Pupils equal, round, reactive to light and accommodation. No scleral icterus. Extraocular muscles intact.  HEENT: Head atraumatic, normocephalic. Oropharynx and nasopharynx clear.  NECK:  Supple, no jugular venous distention. No thyroid enlargement, no tenderness.  LUNGS: Normal breath sounds bilaterally, no wheezing, rales,rhonchi or crepitation. No use of accessory muscles of respiration.  CARDIOVASCULAR: S1, S2 normal. No murmurs, rubs, or gallops.  ABDOMEN: Soft, non-tender, non-distended. Bowel sounds present. No organomegaly or mass.  EXTREMITIES: No pedal edema, cyanosis, or clubbing.  NEUROLOGIC: Cranial nerves II through XII are intact. Muscle strength 5/5 in all extremities. Sensation intact. Gait not checked.  PSYCHIATRIC: The patient is alert and oriented x 3.  SKIN: No obvious rash, lesion, or ulcer.   DATA REVIEW:   CBC  Recent Labs Lab 03/15/17 0524  03/16/17 1528  WBC 6.8  --   --   HGB 6.8*  < > 8.2*  HCT 19.8*  --   --   PLT 135*  --   --   < > = values in this interval not displayed.  Chemistries   Recent Labs Lab 03/14/17 2313 03/15/17 0524  NA 140 140  K 3.6 4.0  CL 107 114*  CO2 22 23  GLUCOSE 156* 113*  BUN 19 18  CREATININE 1.41* 1.20  CALCIUM 8.9 7.8*  AST 21  --   ALT 19  --   ALKPHOS 57  --   BILITOT 0.7  --     Cardiac Enzymes No results for input(s): TROPONINI in the last 168 hours.  Microbiology Results  No results found for this or any previous visit.  RADIOLOGY:  Ct Abdomen Pelvis W Contrast  Result Date: 03/15/2017 CLINICAL DATA:  Acute onset of rectal bleeding.  Diaphoresis. Initial encounter. EXAM: CT ABDOMEN AND PELVIS WITH CONTRAST TECHNIQUE:  Multidetector CT imaging of the abdomen and pelvis was performed using the standard protocol following bolus administration of intravenous contrast. CONTRAST:  ISOVUE-300 IOPAMIDOL (ISOVUE-300) INJECTION 61% COMPARISON:  None. FINDINGS: Lower chest: Enlarged mediastinal and bilateral peribronchial nodes are seen, measuring up to 1.9 cm at the right peribronchial region, and up to 1.3 cm at the azygoesophageal recess. Underlying calcified right hilar nodes are suspected. The distribution would be somewhat unusual for lymphoma. Given that no additional mass is seen on this study, metastatic disease from primary bronchogenic malignancy cannot be excluded. Alternatively, systemic inflammatory conditions might have a similar appearance. CT of the chest would be helpful for further evaluation, when and as deemed clinically appropriate. Minimal scarring is noted at the lung bases. Hepatobiliary: The liver is unremarkable in appearance. The gallbladder is unremarkable in appearance. The common bile duct remains normal in caliber. Pancreas: The pancreas is within normal limits. Spleen: The spleen is unremarkable in appearance. Adrenals/Urinary Tract: The adrenal glands are unremarkable in appearance. Scattered nonobstructing bilateral renal stones are seen, measuring up to 4 mm in size. There is no evidence of hydronephrosis. No obstructing ureteral stones are identified. No perinephric stranding is appreciated. Stomach/Bowel: The appendix is slightly distended, measuring up to 1.2 cm in diameter, but no associated soft tissue inflammation is seen to suggest appendicitis. Scattered diverticulosis is noted along the entirety of the colon, without definite evidence of diverticulitis. The small bowel is grossly unremarkable in appearance. The stomach is grossly unremarkable. Vascular/Lymphatic: The abdominal aorta is unremarkable in  appearance. The inferior vena cava is grossly unremarkable. No retroperitoneal lymphadenopathy is seen. No pelvic sidewall lymphadenopathy is identified. Reproductive: The bladder is mildly distended and grossly unremarkable. The prostate is normal in size. Other: No additional soft tissue abnormalities are seen. Musculoskeletal: No acute osseous abnormalities are identified. Vacuum phenomenon is noted along the lower thoracic and lower lumbar spine. The visualized musculature is unremarkable in appearance. IMPRESSION: 1. Enlarged mediastinal and bilateral peribronchial nodes, measuring up to 1.9 cm at the right peribronchial region. Underlying suspected calcified right hilar nodes. The distribution would be somewhat unusual for lymphoma. Given the lack of additional mass on this study, metastatic disease from primary bronchogenic malignancy cannot be excluded. Alternately, systemic inflammatory conditions might have a similar appearance. CT of the chest would be helpful for further evaluation, when and as deemed clinically appropriate. 2. Scattered diverticulosis along the entirety of the colon, without definite evidence of diverticulitis. This may correspond to the patient's rectal bleeding. 3. Scattered nonobstructing bilateral renal stones measure up to 4 mm in size. 4. Distention of the appendix is thought to reflect the patient's baseline, given the lack of associated soft tissue inflammation. No definite evidence for appendicitis. Electronically Signed   By: Roanna Raider M.D.   On: 03/15/2017 00:20    EKG:   Orders placed or performed during the hospital encounter of 10/27/15  . EKG 12-Lead  . EKG 12-Lead  . ED EKG  . ED EKG  . EKG      Management plans discussed with the patient, family and they are in agreement.  CODE STATUS:     Code Status Orders        Start     Ordered   03/15/17 0223  Full code  Continuous     03/15/17 0222    Code Status History    Date Active Date  Inactive Code Status Order ID Comments User Context   This patient has a current code status  but no historical code status.      TOTAL TIME TAKING CARE OF THIS PATIENT: 35 minutes.    Altamese Dilling M.D on 03/16/2017 at 4:35 PM  Between 7am to 6pm - Pager - [phone]  After 6pm go to [email or web address] - password [email or web address]  [facility]  Office  [phone]  CC: Primary care physician; Mick Sell, MD   Note: This dictation was prepared with Dragon dictation along with smaller phrase technology. Any transcriptional errors that result from this process are unintentional.

## 2017-03-16 NOTE — Anesthesia Post-op Follow-up Note (Signed)
Anesthesia QCDR form completed.        

## 2017-03-16 NOTE — Op Note (Signed)
[facility] Gastroenterology Patient Name: Darrell Freeman Procedure Date: 03/16/2017 1:45 PM MRN: [id number] Account #: [id number] Date of Birth: 09/22/1955 Admit Type: Inpatient Age: 61 Room: [facility] ENDO ROOM 1 Gender: Male Note Status: Finalized Procedure:            Upper GI endoscopy Indications:          Hematochezia Providers:            Wyline Mood MD, MD Medicines:            Monitored Anesthesia Care Complications:        No immediate complications. Procedure:            Pre-Anesthesia Assessment:                       - Prior to the procedure, a History and Physical was                        performed, and patient medications, allergies and                        sensitivities were reviewed. The patient's tolerance of                        previous anesthesia was reviewed.                       - The risks and benefits of the procedure and the                        sedation options and risks were discussed with the                        patient. All questions were answered and informed                        consent was obtained.                       - ASA Grade Assessment: II - A patient with mild                        systemic disease.                       After obtaining informed consent, the endoscope was                        passed under direct vision. Throughout the procedure,                        the patient's blood pressure, pulse, and oxygen                        saturations were monitored continuously. The Endoscope                        was introduced through the mouth, and advanced to the                        third part of duodenum. Findings:      The examined duodenum  was normal.      A small hiatal hernia was present.      LA Grade A (one or more mucosal breaks less than 5 mm, not extending       between tops of 2 mucosal folds) esophagitis with no bleeding was found       at the gastroesophageal junction.      A widely  patent Schatzki ring (acquired) was found at the       gastroesophageal junction.      The exam was otherwise without abnormality. Impression:           - Normal examined duodenum.                       - Small hiatal hernia.                       - LA Grade A reflux esophagitis.                       - Widely patent Schatzki ring.                       - The examination was otherwise normal.                       - No specimens collected. Recommendation:       - Perform a colonoscopy today. Procedure Code(s):    --- Professional ---                       [id number], Esophagogastroduodenoscopy, flexible, transoral;                        diagnostic, including collection of specimen(s) by                        brushing or washing, when performed (separate procedure) CPT copyright 2016 American Medical Association. All rights reserved. The codes documented in this report are preliminary and upon coder review may  be revised to meet current compliance requirements. Wyline Mood, MD Wyline Mood MD, MD 03/16/2017 1:54:48 PM This report has been signed electronically. Number of Addenda: 0 Note Initiated On: 03/16/2017 1:45 PM      [facility]

## 2017-03-16 NOTE — Discharge Planning (Signed)
Patient IV removed x2.  RN assessment and VS revealed stability for DC to home. Discharge papers given, explained and educated.  Informed of suggested FU appt and patient agreed to call to set up since office was closed at DC.  Scripts printed.  Once ready, will be walked to front and family transporting home via car.

## 2017-03-16 NOTE — H&P (Signed)
Wyline Mood MD [address], [address] [place], [place] [address] Phone: [phone] Fax : [phone]  Primary Care Physician:  Mick Sell, MD Primary Gastroenterologist:  Dr. Wyline Mood   Pre-Procedure History & Physical: HPI:  Darrell Freeman is a 61 y.o. male is here for an endoscopy and colonoscopy.   Past Medical History:  Diagnosis Date  . Arthritis   . Diverticulosis   . GERD (gastroesophageal reflux disease)   . GI bleed   . Hypertension     Past Surgical History:  Procedure Laterality Date  . OLECRANON BURSA EXCISION    . OLECRANON BURSECTOMY Left 12/26/2014   Procedure: OLECRANON BURSA;  Surgeon: Myra Rude, MD;  Location: [facility] ORS;  Service: Orthopedics;  Laterality: Left;    Prior to Admission medications   Medication Sig Start Date End Date Taking? Authorizing Provider  allopurinol (ZYLOPRIM) 300 MG tablet Take 300 mg by mouth as needed.   Yes [provider]  aspirin EC 325 MG tablet Take 1 tablet (325 mg total) by mouth daily. Patient taking differently: Take 81 mg by mouth daily.  10/27/15  Yes Rockne Menghini, MD  atorvastatin (LIPITOR) 10 MG tablet Take 1 tablet by mouth daily. 03/10/17  Yes [provider]  losartan (COZAAR) 50 MG tablet Take 50 mg by mouth daily.    Yes [provider]  zolpidem (AMBIEN) 10 MG tablet Take 5-10 mg by mouth at bedtime as needed for sleep.   Yes [provider]  HYDROcodone-acetaminophen (NORCO) 5-325 MG per tablet Take 1 tablet by mouth every 6 (six) hours as needed for moderate pain. Patient not taking: Reported on 03/14/2017 12/26/14   Myra Rude, MD  ibuprofen (ADVIL,MOTRIN) 200 MG tablet Take 200 mg by mouth every 6 (six) hours as needed.    [provider]    Allergies as of 03/14/2017  . (No Known Allergies)    History reviewed. No pertinent family history.  Social History   Social History  . Marital status: Married    Spouse name: N/A    . Number of children: N/A  . Years of education: N/A   Occupational History  . Not on file.   Social History Main Topics  . Smoking status: Former Smoker    Years: 7.00    Types: Cigarettes    Quit date: 12/11/1984  . Smokeless tobacco: Never Used  . Alcohol use Yes     Comment: OCC  . Drug use: No  . Sexual activity: Not on file   Other Topics Concern  . Not on file   Social History Narrative  . No narrative on file    Review of Systems: See HPI, otherwise negative ROS  Physical Exam: BP (!) 146/85   Pulse 97   Temp (!) 97 F (36.1 C) (Tympanic)   Resp 18   Ht  (1.651 m)   Wt 188 lb 12.8 oz (85.6 kg)   SpO2 100%   BMI 31.42 kg/m  General:   Alert,  pleasant and cooperative in NAD Head:  Normocephalic and atraumatic. Neck:  Supple; no masses or thyromegaly. Lungs:  Clear throughout to auscultation.    Heart:  Regular rate and rhythm. Abdomen:  Soft, nontender and nondistended. Normal bowel sounds, without guarding, and without rebound.   Neurologic:  Alert and  oriented x4;  grossly normal neurologically.  Impression/Plan: Darrell Freeman is here for an endoscopy and colonoscopy to be performed for rectal bleeding   Risks, benefits, limitations,  and alternatives regarding  endoscopy and colonoscopy have been reviewed with the patient.  Questions have been answered.  All parties agreeable.   Wyline Mood, MD  03/16/2017, 1:17 PM

## 2017-03-16 NOTE — Discharge Instructions (Signed)
° °  Blood Transfusion, Care After This sheet gives you information about how to care for yourself after your procedure. Your doctor may also give you more specific instructions. If you have problems or questions, contact your doctor. Follow these instructions at home:  Take over-the-counter and prescription medicines only as told by your doctor.  Go back to your normal activities as told by your doctor.  Follow instructions from your doctor about how to take care of the area where an IV tube was put into your vein (insertion site). Make sure you: ? Wash your hands with soap and water before you change your bandage (dressing). If there is no soap and water, use hand sanitizer. ? Change your bandage as told by your doctor.  Check your IV insertion site every day for signs of infection. Check for: ? More redness, swelling, or pain. ? More fluid or blood. ? Warmth. ? Pus or a bad smell. Contact a doctor if:  You have more redness, swelling, or pain around the IV insertion site..  You have more fluid or blood coming from the IV insertion site.  Your IV insertion site feels warm to the touch.  You have pus or a bad smell coming from the IV insertion site.  Your pee (urine) turns pink, red, or brown.  You feel weak after doing your normal activities. Get help right away if:  You have signs of a serious allergic or body defense (immune) system reaction, including: ? Itchiness. ? Hives. ? Trouble breathing. ? Anxiety. ? Pain in your chest or lower back. ? Fever, flushing, and chills. ? Fast pulse. ? Rash. ? Watery poop (diarrhea). ? Throwing up (vomiting). ? Dark pee. ? Serious headache. ? Dizziness. ? Stiff neck. ? Yellow color in your face or the white parts of your eyes (jaundice). Summary  After a blood transfusion, return to your normal activities as told by your doctor.  Every day, check for signs of infection where the IV tube was put into your vein.  Some signs of  infection are warm skin, more redness and pain, more fluid or blood, and pus or a bad smell where the needle went in.  Contact your doctor if you feel weak or have any unusual symptoms. This information is not intended to replace advice given to you by your health care provider. Make sure you discuss any questions you have with your health care provider. Document Released: 06/13/2014 Document Revised: 01/15/2016 Document Reviewed: 01/15/2016 Elsevier Interactive Patient Education  2017 Elsevier Inc. DO NOT use NSAIDS.

## 2017-03-16 NOTE — Op Note (Signed)
[facility] Gastroenterology Patient Name: Darrell Freeman Procedure Date: 03/16/2017 1:45 PM MRN: [id number] Account #: [id number] Date of Birth: [birth year] Admit Type: Inpatient Age: 61 Room: [facility] ENDO ROOM 1 Gender: Male Note Status: Finalized Procedure:            Colonoscopy Indications:          Hematochezia Providers:            Wyline Mood MD, MD Medicines:            Monitored Anesthesia Care Complications:        No immediate complications. Procedure:            Pre-Anesthesia Assessment:                       - Prior to the procedure, a History and Physical was                        performed, and patient medications, allergies and                        sensitivities were reviewed. The patient's tolerance of                        previous anesthesia was reviewed.                       - The risks and benefits of the procedure and the                        sedation options and risks were discussed with the                        patient. All questions were answered and informed                        consent was obtained.                       - ASA Grade Assessment: II - A patient with mild                        systemic disease.                       After obtaining informed consent, the colonoscope was                        passed under direct vision. Throughout the procedure,                        the patient's blood pressure, pulse, and oxygen                        saturations were monitored continuously. The                        Colonoscope was introduced through the anus and                        advanced to the the terminal ileum. The colonoscopy was  performed with ease. The patient tolerated the                        procedure well. The quality of the bowel preparation                        was fair. Findings:      The perianal and digital rectal examinations were normal.      Non-bleeding internal  hemorrhoids were found during retroflexion. The       hemorrhoids were large and Grade I (internal hemorrhoids that do not       prolapse).      Multiple medium-mouthed diverticula were found in the sigmoid colon.      A moderate amount of semi-liquid stool was found in the entire colon,       interfering with visualization. Impression:           - Preparation of the colon was fair.                       - Non-bleeding internal hemorrhoids.                       - Diverticulosis in the sigmoid colon.                       - Stool in the entire examined colon.                       - No specimens collected. Recommendation:       - Return patient to hospital ward for ongoing care.                       - Advance diet as tolerated.                       - Continue present medications.                       - 1. Prep inadequate for polyp detection                       2. No blood seen in the colon, stomach or the terminal                        ileum. Bleeding has stopped.                       3. Likely cause of bleed is diverticular                       4. Advance diet and can be discharged from GI pointof                        view                       5. Check CBC with PCP in 5-7 days                       6. No NSAID's Procedure Code(s):    --- Professional ---  16109, Colonoscopy, flexible; diagnostic, including                        collection of specimen(s) by brushing or washing, when                        performed (separate procedure) Diagnosis Code(s):    --- Professional ---                       K64.0, First degree hemorrhoids                       K57.30, Diverticulosis of large intestine without                        perforation or abscess without bleeding                       K92.1, Melena (includes Hematochezia) CPT copyright 2016 American Medical Association. All rights reserved. The codes documented in this report are preliminary and upon  coder review may  be revised to meet current compliance requirements. Wyline Mood, MD Wyline Mood MD, MD 03/16/2017 2:07:32 PM This report has been signed electronically. Number of Addenda: 0 Note Initiated On: 03/16/2017 1:45 PM Scope Withdrawal Time: 0 hours 4 minutes 41 seconds  Total Procedure Duration: 0 hours 5 minutes 59 seconds       [facility]

## 2017-03-16 NOTE — Transfer of Care (Signed)
Immediate Anesthesia Transfer of Care Note  Patient: Darrell Freeman  Procedure(s) Performed: ESOPHAGOGASTRODUODENOSCOPY (EGD) WITH PROPOFOL (N/A ) COLONOSCOPY WITH PROPOFOL (N/A )  Patient Location: Endoscopy Unit  Anesthesia Type:General  Level of Consciousness: awake and alert   Airway & Oxygen Therapy: Patient connected to nasal cannula oxygen  Post-op Assessment: Post -op Vital signs reviewed and stable  Post vital signs: stable  Last Vitals:  Vitals:   03/16/17 1259 03/16/17 1409  BP: (!) 146/85 101/74  Pulse: 97 (!) 103  Resp: 18 14  Temp: (!) 36.1 C 37.3 C  SpO2: 100% 96%    Last Pain:  Vitals:   03/16/17 1409  TempSrc: Tympanic  PainSc:       Patients Stated Pain Goal: 0 (03/16/17 1259)  Complications: No apparent anesthesia complications

## 2017-03-16 NOTE — Plan of Care (Signed)
Patient IV patent. NPO since midnight and finished bowel prep about 10PM last night.  Current BMs mainly clear/no form.  No meds given, Hemoglobin 8.1 after 2 units 10/10.  NO n/v noted, pain stable.  Consents x2 signed. CHG x2 completed.  No jewelry or dentures in place.  Report called to Shon Hough. On OR schedule for around 12pm.

## 2017-03-16 NOTE — Anesthesia Postprocedure Evaluation (Signed)
Anesthesia Post Note  Patient: Darrell Freeman  Procedure(s) Performed: ESOPHAGOGASTRODUODENOSCOPY (EGD) WITH PROPOFOL (N/A ) COLONOSCOPY WITH PROPOFOL (N/A )  Patient location during evaluation: PACU Anesthesia Type: General Level of consciousness: awake and alert Pain management: pain level controlled Vital Signs Assessment: post-procedure vital signs reviewed and stable Respiratory status: spontaneous breathing, nonlabored ventilation, respiratory function stable and patient connected to nasal cannula oxygen Cardiovascular status: blood pressure returned to baseline and stable Postop Assessment: no apparent nausea or vomiting Anesthetic complications: no     Last Vitals:  Vitals:   03/16/17 1420 03/16/17 1430  BP: 123/88 106/75  Pulse: 91   Resp: 16   Temp:    SpO2:      Last Pain:  Vitals:   03/16/17 1410  TempSrc: Tympanic  PainSc:                  Yevette Edwards

## 2017-03-16 NOTE — Anesthesia Preprocedure Evaluation (Signed)
Anesthesia Evaluation  Patient identified by MRN, date of birth, ID band Patient awake    Reviewed: Allergy & Precautions, H&P , NPO status , Patient's Chart, lab work & pertinent test results, reviewed documented beta blocker date and time   Airway Mallampati: II   Neck ROM: full    Dental  (+) Poor Dentition, Teeth Intact   Pulmonary neg pulmonary ROS, former smoker,    Pulmonary exam normal        Cardiovascular hypertension, negative cardio ROS Normal cardiovascular exam Rhythm:regular Rate:Normal     Neuro/Psych negative neurological ROS  negative psych ROS   GI/Hepatic negative GI ROS, Neg liver ROS, GERD  Medicated,  Endo/Other  negative endocrine ROS  Renal/GU negative Renal ROS  negative genitourinary   Musculoskeletal   Abdominal   Peds  Hematology negative hematology ROS (+)   Anesthesia Other Findings Past Medical History: No date: Arthritis No date: Diverticulosis No date: GERD (gastroesophageal reflux disease) No date: GI bleed No date: Hypertension Past Surgical History: No date: OLECRANON BURSA EXCISION 12/26/2014: OLECRANON BURSECTOMY; Left     Comment:  Procedure: OLECRANON BURSA;  Surgeon: Myra Rude,              MD;  Location: [facility] ORS;  Service: Orthopedics;                Laterality: Left; BMI    Body Mass Index:  31.42 kg/m     Reproductive/Obstetrics negative OB ROS                             Anesthesia Physical Anesthesia Plan  ASA: II  Anesthesia Plan: General   Post-op Pain Management:    Induction:   PONV Risk Score and Plan:   Airway Management Planned:   Additional Equipment:   Intra-op Plan:   Post-operative Plan:   Informed Consent: I have reviewed the patients History and Physical, chart, labs and discussed the procedure including the risks, benefits and alternatives for the proposed anesthesia with the patient or  authorized representative who has indicated his/her understanding and acceptance.   Dental Advisory Given  Plan Discussed with: CRNA  Anesthesia Plan Comments:         Anesthesia Quick Evaluation

## 2017-03-17 ENCOUNTER — Encounter: Payer: Self-pay | Admitting: Gastroenterology

## 2017-03-17 LAB — H. PYLORI ANTIGEN, STOOL: H. Pylori Stool Ag, Eia: NEGATIVE

## 2017-03-23 ENCOUNTER — Telehealth: Payer: Self-pay | Admitting: Gastroenterology

## 2017-03-23 NOTE — Telephone Encounter (Signed)
Patient saw Dr. Tobi BastosAnna at the hospital for colonoscopy/EGD and was given protonics. He stated he was a patient of Dr. Iline OvenElliotts. He has some questions regarding the protonics and needs to talk to you since Dr. Tobi BastosAnna prescribed them to him. He also said if you call it may take him a few minutes to get to his phone so if he doesn't answer please leave your name and number and he will call you right back

## 2017-03-23 NOTE — Telephone Encounter (Signed)
-----   Message from Wyline MoodKiran Anna, MD sent at 03/21/2017  8:35 AM EDT ----- H pylori stool antigen negative

## 2017-03-23 NOTE — Telephone Encounter (Signed)
Returning patient's call.   LVM for callback for results per Dr. Tobi BastosAnna.   H pylori stool antigen negative

## 2017-05-09 ENCOUNTER — Other Ambulatory Visit: Payer: Self-pay | Admitting: Specialist

## 2017-05-09 DIAGNOSIS — R0602 Shortness of breath: Secondary | ICD-10-CM

## 2017-05-09 DIAGNOSIS — D869 Sarcoidosis, unspecified: Secondary | ICD-10-CM

## 2018-09-05 ENCOUNTER — Other Ambulatory Visit
Admission: RE | Admit: 2018-09-05 | Discharge: 2018-09-05 | Disposition: A | Discharge status: Home / Self Care | Payer: Managed Care, Other (non HMO) | Source: Ambulatory Visit | Attending: Sports Medicine | Admitting: Sports Medicine

## 2018-09-05 DIAGNOSIS — G8929 Other chronic pain: Secondary | ICD-10-CM | POA: Insufficient documentation

## 2018-09-05 DIAGNOSIS — M25561 Pain in right knee: Secondary | ICD-10-CM | POA: Insufficient documentation

## 2018-09-05 DIAGNOSIS — M7041 Prepatellar bursitis, right knee: Secondary | ICD-10-CM | POA: Insufficient documentation

## 2018-09-05 DIAGNOSIS — M25461 Effusion, right knee: Secondary | ICD-10-CM | POA: Diagnosis not present

## 2018-09-05 LAB — SYNOVIAL CELL COUNT + DIFF, W/ CRYSTALS
Eosinophils-Synovial: 0 %
Lymphocytes-Synovial Fld: 37 %
Monocyte-Macrophage-Synovial Fluid: 27 %
Neutrophil, Synovial: 36 %
WBC, Synovial: 29789 /mm3 — ABNORMAL HIGH (ref 0–200)

## 2018-10-17 ENCOUNTER — Encounter
Admission: EM | Disposition: A | Discharge status: Home / Self Care | Payer: Self-pay | Source: Home / Self Care | Attending: Internal Medicine

## 2018-10-17 ENCOUNTER — Inpatient Hospital Stay
Admission: EM | Admit: 2018-10-17 | Discharge: 2018-10-18 | DRG: 357 | Disposition: A | Discharge status: Home / Self Care | Payer: Managed Care, Other (non HMO) | Attending: Internal Medicine | Admitting: Internal Medicine

## 2018-10-17 ENCOUNTER — Inpatient Hospital Stay: Payer: Managed Care, Other (non HMO)

## 2018-10-17 ENCOUNTER — Other Ambulatory Visit: Payer: Self-pay

## 2018-10-17 DIAGNOSIS — K5731 Diverticulosis of large intestine without perforation or abscess with bleeding: Principal | ICD-10-CM | POA: Diagnosis present

## 2018-10-17 DIAGNOSIS — K210 Gastro-esophageal reflux disease with esophagitis: Secondary | ICD-10-CM | POA: Diagnosis present

## 2018-10-17 DIAGNOSIS — Z79899 Other long term (current) drug therapy: Secondary | ICD-10-CM | POA: Diagnosis not present

## 2018-10-17 DIAGNOSIS — K5792 Diverticulitis of intestine, part unspecified, without perforation or abscess without bleeding: Secondary | ICD-10-CM | POA: Diagnosis not present

## 2018-10-17 DIAGNOSIS — N179 Acute kidney failure, unspecified: Secondary | ICD-10-CM | POA: Diagnosis present

## 2018-10-17 DIAGNOSIS — K219 Gastro-esophageal reflux disease without esophagitis: Secondary | ICD-10-CM | POA: Diagnosis present

## 2018-10-17 DIAGNOSIS — K922 Gastrointestinal hemorrhage, unspecified: Secondary | ICD-10-CM | POA: Diagnosis present

## 2018-10-17 DIAGNOSIS — Z1159 Encounter for screening for other viral diseases: Secondary | ICD-10-CM

## 2018-10-17 DIAGNOSIS — M1990 Unspecified osteoarthritis, unspecified site: Secondary | ICD-10-CM | POA: Diagnosis present

## 2018-10-17 DIAGNOSIS — D500 Iron deficiency anemia secondary to blood loss (chronic): Secondary | ICD-10-CM | POA: Diagnosis present

## 2018-10-17 DIAGNOSIS — I10 Essential (primary) hypertension: Secondary | ICD-10-CM | POA: Diagnosis present

## 2018-10-17 DIAGNOSIS — Z87891 Personal history of nicotine dependence: Secondary | ICD-10-CM

## 2018-10-17 DIAGNOSIS — Z9889 Other specified postprocedural states: Secondary | ICD-10-CM | POA: Diagnosis not present

## 2018-10-17 DIAGNOSIS — R0602 Shortness of breath: Secondary | ICD-10-CM

## 2018-10-17 HISTORY — PX: VISCERAL ANGIOGRAPHY: CATH118276

## 2018-10-17 LAB — CBC
HCT: 39.5 % (ref 39.0–52.0)
Hemoglobin: 13.4 g/dL (ref 13.0–17.0)
MCH: 29.6 pg (ref 26.0–34.0)
MCHC: 33.9 g/dL (ref 30.0–36.0)
MCV: 87.2 fL (ref 80.0–100.0)
Platelets: 182 10*3/uL (ref 150–400)
RBC: 4.53 MIL/uL (ref 4.22–5.81)
RDW: 12.7 % (ref 11.5–15.5)
WBC: 6.2 10*3/uL (ref 4.0–10.5)
nRBC: 0 % (ref 0.0–0.2)

## 2018-10-17 LAB — COMPREHENSIVE METABOLIC PANEL
ALT: 23 U/L (ref 0–44)
AST: 24 U/L (ref 15–41)
Albumin: 3.9 g/dL (ref 3.5–5.0)
Alkaline Phosphatase: 68 U/L (ref 38–126)
Anion gap: 9 (ref 5–15)
BUN: 23 mg/dL (ref 8–23)
CO2: 23 mmol/L (ref 22–32)
Calcium: 9.3 mg/dL (ref 8.9–10.3)
Chloride: 109 mmol/L (ref 98–111)
Creatinine, Ser: 1.36 mg/dL — ABNORMAL HIGH (ref 0.61–1.24)
GFR calc Af Amer: >60 mL/min (ref >60)
GFR calc non Af Amer: 56 mL/min — ABNORMAL LOW (ref >60)
Glucose, Bld: 131 mg/dL — ABNORMAL HIGH (ref 70–99)
Potassium: 4.1 mmol/L (ref 3.5–5.1)
Sodium: 141 mmol/L (ref 135–145)
Total Bilirubin: 0.7 mg/dL (ref 0.3–1.2)
Total Protein: 7 g/dL (ref 6.5–8.1)

## 2018-10-17 LAB — TYPE AND SCREEN
ABO/RH(D): A POS
Antibody Screen: NEGATIVE

## 2018-10-17 LAB — HEMOGLOBIN
Hemoglobin: 11.6 g/dL — ABNORMAL LOW (ref 13.0–17.0)
Hemoglobin: 10.3 g/dL — ABNORMAL LOW (ref 13.0–17.0)

## 2018-10-17 LAB — PROTIME-INR
INR: 0.9 (ref 0.8–1.2)
Prothrombin Time: 12.1 seconds (ref 11.4–15.2)

## 2018-10-17 LAB — GLUCOSE, CAPILLARY: Glucose-Capillary: 135 mg/dL — ABNORMAL HIGH (ref 70–99)

## 2018-10-17 LAB — SARS CORONAVIRUS 2 BY RT PCR (HOSPITAL ORDER, PERFORMED IN ~~LOC~~ HOSPITAL LAB): SARS Coronavirus 2: NEGATIVE

## 2018-10-17 SURGERY — VISCERAL ANGIOGRAPHY
Anesthesia: Moderate Sedation

## 2018-10-17 MED ORDER — CEFAZOLIN SODIUM-DEXTROSE 2-4 GM/100ML-% IV SOLN
2.0000 g | INTRAVENOUS | Status: AC
  Filled 2018-10-17: qty 100

## 2018-10-17 MED ORDER — TECHNETIUM TC 99M-LABELED RED BLOOD CELLS IV KIT
20.2750 | PACK | Freq: Once | INTRAVENOUS | Status: AC | PRN
  Administered 2018-10-17: 12:00:00 20.275 via INTRAVENOUS

## 2018-10-17 MED ORDER — FENTANYL CITRATE (PF) 100 MCG/2ML IJ SOLN
INTRAMUSCULAR | Status: DC | PRN
  Administered 2018-10-17: 50 ug via INTRAVENOUS

## 2018-10-17 MED ORDER — ATROPINE SULFATE 1 MG/10ML IJ SOSY
0.4000 mg | PREFILLED_SYRINGE | Freq: Once | INTRAMUSCULAR | Status: AC
  Administered 2018-10-17: 18:00:00 0.4 mg via INTRAVENOUS

## 2018-10-17 MED ORDER — POLYETHYLENE GLYCOL 3350 17 G PO PACK: 17.0000 g | PACK | Freq: Every day | ORAL | Status: DC | PRN

## 2018-10-17 MED ORDER — ACETAMINOPHEN 325 MG PO TABS
650.0000 mg | ORAL_TABLET | Freq: Four times a day (QID) | ORAL | Status: DC | PRN
  Administered 2018-10-18: 650 mg via ORAL
  Filled 2018-10-17: qty 2

## 2018-10-17 MED ORDER — SODIUM CHLORIDE 0.9 % IV SOLN
INTRAVENOUS | Status: AC
  Administered 2018-10-17: 16:00:00 via INTRAVENOUS

## 2018-10-17 MED ORDER — LOSARTAN POTASSIUM 50 MG PO TABS
50.0000 mg | ORAL_TABLET | Freq: Every day | ORAL | Status: DC
  Administered 2018-10-18: 09:00:00 50 mg via ORAL
  Filled 2018-10-17: qty 1

## 2018-10-17 MED ORDER — FENTANYL CITRATE (PF) 100 MCG/2ML IJ SOLN
INTRAMUSCULAR | Status: AC
  Filled 2018-10-17: qty 2

## 2018-10-17 MED ORDER — ONDANSETRON HCL 4 MG/2ML IJ SOLN
INTRAMUSCULAR | Status: AC
  Filled 2018-10-17: qty 2

## 2018-10-17 MED ORDER — ONDANSETRON HCL 4 MG PO TABS: 4.0000 mg | ORAL_TABLET | Freq: Four times a day (QID) | ORAL | Status: DC | PRN

## 2018-10-17 MED ORDER — SODIUM CHLORIDE 0.9 % IV SOLN
INTRAVENOUS | Status: DC
  Administered 2018-10-17 – 2018-10-18 (×2): via INTRAVENOUS

## 2018-10-17 MED ORDER — IOHEXOL 300 MG/ML  SOLN
INTRAMUSCULAR | Status: DC | PRN
  Administered 2018-10-17: 17:00:00 40 mL via INTRA_ARTERIAL

## 2018-10-17 MED ORDER — MIDAZOLAM HCL 2 MG/2ML IJ SOLN
INTRAMUSCULAR | Status: AC
  Filled 2018-10-17: qty 2

## 2018-10-17 MED ORDER — ONDANSETRON HCL 4 MG/2ML IJ SOLN
4.0000 mg | Freq: Four times a day (QID) | INTRAMUSCULAR | Status: DC | PRN
  Administered 2018-10-17: 17:00:00 4 mg via INTRAVENOUS

## 2018-10-17 MED ORDER — MIDAZOLAM HCL 2 MG/2ML IJ SOLN
INTRAMUSCULAR | Status: DC | PRN
  Administered 2018-10-17: 2 mg via INTRAVENOUS

## 2018-10-17 MED ORDER — PANTOPRAZOLE SODIUM 40 MG IV SOLR
40.0000 mg | Freq: Two times a day (BID) | INTRAVENOUS | Status: DC
  Administered 2018-10-17 – 2018-10-18 (×2): 40 mg via INTRAVENOUS
  Filled 2018-10-17 (×2): qty 40

## 2018-10-17 MED ORDER — ACETAMINOPHEN 650 MG RE SUPP: 650.0000 mg | Freq: Four times a day (QID) | RECTAL | Status: DC | PRN

## 2018-10-17 MED ORDER — SODIUM CHLORIDE 0.9 % IV SOLN: INTRAVENOUS | Status: DC

## 2018-10-17 MED ORDER — ATROPINE SULFATE 1 MG/10ML IJ SOSY
PREFILLED_SYRINGE | INTRAMUSCULAR | Status: AC
  Administered 2018-10-17: 18:00:00 0.4 mg via INTRAVENOUS
  Filled 2018-10-17: qty 10

## 2018-10-17 SURGICAL SUPPLY — 12 items
BLOCK BEAD 500-700 (Vascular Products) ×3 IMPLANT
CATH BEACON 5 .035 65 C2 TIP (CATHETERS) ×3 IMPLANT
CATH MICROCATH PRGRT 2.8F 110 (CATHETERS) ×1 IMPLANT
CATH PIG 70CM (CATHETERS) ×3 IMPLANT
DEVICE STARCLOSE SE CLOSURE (Vascular Products) ×3 IMPLANT
GLIDEWIRE STIFF .35X180X3 HYDR (WIRE) ×3 IMPLANT
MICROCATH PROGREAT 2.8F 110 CM (CATHETERS) ×3
PACK ANGIOGRAPHY (CUSTOM PROCEDURE TRAY) ×3 IMPLANT
SHEATH BRITE TIP 5FRX11 (SHEATH) ×3 IMPLANT
SYR MEDRAD MARK 7 150ML (SYRINGE) ×3 IMPLANT
TUBING CONTRAST HIGH PRESS 72 (TUBING) ×3 IMPLANT
WIRE J 3MM .035X145CM (WIRE) ×3 IMPLANT

## 2018-10-17 NOTE — Consult Note (Addendum)
GI Inpatient Consult Note  Reason for Consult: Lower GI Bleed   Attending Requesting Consult: Dr. Adrian SaranSital Mody, MD  History of Present Illness: Darrell Freeman is a 63 y.o. male seen for evaluation of acute lower GI bleeding at the request of Dr. Juliene PinaMody. Patient has a PMH of diverticular bleed 03/2017 where he was admitted 10/09 -03/16/2017 for hematochezia. He underwent EGD and colonoscopy at this time which were negative for acute bleeding. His hematochezia was suspected s/t diverticulosis. Patient presented to the ED today after waking up this morning around 530 AM and noticed darker appearing stool. He reports 2-3 additional BMs this morning which progressively became bright red. He has had 3 large episodes of hematochezia while in the ED. Hemoglobin is 13.4. He denies any fever, abdominal pain, cramping, melena, rectal pain, sick contacts, nausea, or vomiting. Since he had several hematochezia episodes in the ED, GI bleeding scan was ordered. At time of this note, patient is undergoing bleeding scan. He took ibuprofen twice in the past month. He reports he was lifting weights yesterday.    Last Colonoscopy:  03/2017 - Preparation of the colon was fair. - Non-bleeding internal hemorrhoids. - Diverticulosis in the sigmoid colon. - Stool in the entire examined colon. - No specimens collected. Last Endoscopy:  03/2017 - Normal examined duodenum. - Small hiatal hernia. - LA Grade A reflux esophagitis. - Widely patent Schatzki ring. - The examination was otherwise normal. - No specimens collected.   Past Medical History:  Past Medical History:  Diagnosis Date  . Arthritis   . Diverticulosis   . GERD (gastroesophageal reflux disease)   . GI bleed   . Hypertension     Problem List: Patient Active Problem List   Diagnosis Date Noted  . GIB (gastrointestinal bleeding) 10/17/2018  . GI bleed 03/15/2017  . HTN (hypertension) 03/15/2017  . GERD (gastroesophageal reflux disease)  03/15/2017    Past Surgical History: Past Surgical History:  Procedure Laterality Date  . COLONOSCOPY WITH PROPOFOL N/A 03/16/2017   Procedure: COLONOSCOPY WITH PROPOFOL;  Surgeon: Wyline MoodAnna, Kiran, MD;  [facility];  Service: Gastroenterology;  Laterality: N/A;  . ESOPHAGOGASTRODUODENOSCOPY (EGD) WITH PROPOFOL N/A 03/16/2017   Procedure: ESOPHAGOGASTRODUODENOSCOPY (EGD) WITH PROPOFOL;  Surgeon: Wyline MoodAnna, Kiran, MD;  [facility];  Service: Gastroenterology;  Laterality: N/A;  . OLECRANON BURSA EXCISION    . OLECRANON BURSECTOMY Left 12/26/2014   Procedure: OLECRANON BURSA;  Surgeon: Myra Rudehristopher Smith, MD;  Location: [facility] ORS;  Service: Orthopedics;  Laterality: Left;    Allergies: No Known Allergies  Home Medications: (Not in a hospital admission)  Home medication reconciliation was completed with the patient.   Scheduled Inpatient Medications:    Continuous Inpatient Infusions:    PRN Inpatient Medications:    Family History: family history is not on file.  The patient's family history is negative for inflammatory bowel disorders, GI malignancy, or solid organ transplantation.  Social History:   reports that he quit smoking about 33 years ago. His smoking use included cigarettes. He quit after 7.00 years of use. He has never used smokeless tobacco. He reports current alcohol use. He reports that he does not use drugs. The patient denies ETOH, tobacco, or drug use.   Review of Systems: Constitutional: Weight is stable.  Eyes: No changes in vision. ENT: No oral lesions, sore throat.  GI: see HPI.  Heme/Lymph: No easy bruising.  CV: No chest pain.  GU: No hematuria.  Integumentary: No rashes.  Neuro: No headaches.  Psych:  No depression/anxiety.  Endocrine: No heat/cold intolerance.  Allergic/Immunologic: No urticaria.  Resp: No cough, SOB.  Musculoskeletal: No joint swelling.    Physical Examination: BP 124/81 (BP Location: Left Arm, Cuff Size:  Normal)   Pulse 83   Temp 98.2 F (36.8 C) (Oral)   Resp 18   Ht 5\' 6"  (1.676 m)   Wt 83.9 kg   SpO2 99%   BMI 29.86 kg/m  Gen: NAD, alert and oriented x 4 HEENT: PEERLA, EOMI, Neck: supple, no JVD or thyromegaly Chest: CTA bilaterally, no wheezes, crackles, or other adventitious sounds CV: RRR, no m/g/c/r Abd: soft, NT, ND, +BS in all four quadrants; no HSM, guarding, ridigity, or rebound tenderness Ext: no edema, well perfused with 2+ pulses, Skin: no rash or lesions noted Lymph: no LAD  Data: Lab Results  Component Value Date   WBC 6.2 10/17/2018   HGB 13.4 10/17/2018   HCT 39.5 10/17/2018   MCV 87.2 10/17/2018   PLT 182 10/17/2018   Recent Labs  Lab 10/17/18 0813  HGB 13.4   Lab Results  Component Value Date   NA 141 10/17/2018   K 4.1 10/17/2018   CL 109 10/17/2018   CO2 23 10/17/2018   BUN 23 10/17/2018   CREATININE 1.36 (H) 10/17/2018   Lab Results  Component Value Date   ALT 23 10/17/2018   AST 24 10/17/2018   ALKPHOS 68 10/17/2018   BILITOT 0.7 10/17/2018   Recent Labs  Lab 10/17/18 0813  INR 0.9   Assessment/Plan:  63 y/o AA male with a PMH of HTN, diverticulosis, and GERD presented to the ED today for hematochezia  1. Lower GI bleeding - Differential includes diverticular bleed, angiodysplasia, anal outlet etiology, colon polyps, malignancy, IBD, ischemic colitis  - Agree with GI bleeding scan for further evaluation to locate specific site of bleeding - If GI bleeding scan shows specific location, consider vascular consult for further intervention - Will likely need luminal evaluation with colonoscopy and EGD - Agree with PPI IV - Continue to check serial hemoglobin q6h. Transfuse for Hgb <7 - NPO  Further recs after GI Bleeding scan  ADDENDUM 10/17/2018 1430 - GI Bleeding scan positive for active GI bleed, source appears to be proximal right colon - Consult to vascular surgery for embolization     Thank you for the consult.  Please call with questions or concerns.  [facility] [phone] (Cell)

## 2018-10-17 NOTE — Consult Note (Signed)
Full note to follow GI bleeding scan reviewed and clearly positive in the right colon Would benefit from embolization and will plan this afternoon

## 2018-10-17 NOTE — ED Notes (Signed)
Dr. Juliene Pina at bedside.  Pt being transferred to NM after her assessment.

## 2018-10-17 NOTE — OR Nursing (Signed)
Pt nauseated, stomach cramping zofran 4 mg iv given

## 2018-10-17 NOTE — Consult Note (Signed)
[facility] Vascular Consult Note  MRN : [id number]  Darrell Freeman is a 63 y.o. (December 02, 1955) male who presents with chief complaint of  Chief Complaint  Patient presents with  . GI Bleeding  .  History of Present Illness: I am asked to see the patient by Dr. Juliene Pina for GI bleeding.  The patient began having bright red blood per rectum today and this has continued.  He had a similar episode in 2018 that was managed conservatively with endoscopy and eventually stopped.  He had a diverticular bleed and ultimately has not had any issues since this time.  His bleeding is painless.  He has no other major issues.  No fevers or chills.  He has undergone a bleeding scan which I have independently reviewed which clearly shows brisk extravasation from the right colon and active bleeding.  As such, he was seen by GI and we were consulted for further evaluation and treatment  Current Facility-Administered Medications  Medication Dose Route Frequency Provider Last Rate Last Dose  . 0.9 %  sodium chloride infusion   Intravenous Continuous Adrian Saran, MD 100 mL/hr at 10/17/18 1422    . 0.9 %  sodium chloride infusion   Intravenous Continuous Dew, Marlow Baars, MD      . Mitzi Hansen Hold] acetaminophen (TYLENOL) tablet 650 mg  650 mg Oral Q6H PRN Adrian Saran, MD       Or  . Mitzi Hansen Hold] acetaminophen (TYLENOL) suppository 650 mg  650 mg Rectal Q6H PRN Mody, Sital, MD      . ceFAZolin (ANCEF) IVPB 2g/100 mL premix  2 g Intravenous 30 min Pre-Op Annice Needy, MD      . Mitzi Hansen Hold] losartan (COZAAR) tablet 50 mg  50 mg Oral Daily Adrian Saran, MD   Stopped at 10/17/18 1501  . [MAR Hold] ondansetron (ZOFRAN) tablet 4 mg  4 mg Oral Q6H PRN Adrian Saran, MD       Or  . Mitzi Hansen Hold] ondansetron (ZOFRAN) injection 4 mg  4 mg Intravenous Q6H PRN Adrian Saran, MD      . Mitzi Hansen Hold] pantoprazole (PROTONIX) injection 40 mg  40 mg Intravenous Q12H Mody, Sital, MD      . Mitzi Hansen Hold] polyethylene glycol (MIRALAX /  GLYCOLAX) packet 17 g  17 g Oral Daily PRN Adrian Saran, MD        Past Medical History:  Diagnosis Date  . Arthritis   . Diverticulosis   . GERD (gastroesophageal reflux disease)   . GI bleed   . Hypertension     Past Surgical History:  Procedure Laterality Date  . COLONOSCOPY WITH PROPOFOL N/A 03/16/2017   Procedure: COLONOSCOPY WITH PROPOFOL;  Surgeon: Wyline Mood, MD;  Location: [facility] ENDOSCOPY;  Service: Gastroenterology;  Laterality: N/A;  . ESOPHAGOGASTRODUODENOSCOPY (EGD) WITH PROPOFOL N/A 03/16/2017   Procedure: ESOPHAGOGASTRODUODENOSCOPY (EGD) WITH PROPOFOL;  Surgeon: Wyline Mood, MD;  Location: [facility] ENDOSCOPY;  Service: Gastroenterology;  Laterality: N/A;  . OLECRANON BURSA EXCISION    . OLECRANON BURSECTOMY Left 12/26/2014   Procedure: OLECRANON BURSA;  Surgeon: Myra Rude, MD;  Location: [facility] ORS;  Service: Orthopedics;  Laterality: Left;    Social History Social History   Tobacco Use  . Smoking status: Former Smoker    Years: 7.00    Types: Cigarettes    Last attempt to quit: 12/11/1984    Years since quitting: 33.8  . Smokeless tobacco: Never Used  Substance Use Topics  . Alcohol use: Yes  Comment: OCC  . Drug use: No    Family History No bleeding disorders, clotting disorders, aneurysms, or autoimmune diseases.   No Known Allergies   REVIEW OF SYSTEMS (Negative unless checked)  Constitutional: [] Weight loss  [] Fever  [] Chills Cardiac: [] Chest pain   [] Chest pressure   [] Palpitations   [] Shortness of breath when laying flat   [] Shortness of breath at rest   [] Shortness of breath with exertion. Vascular:  [] Pain in legs with walking   [] Pain in legs at rest   [] Pain in legs when laying flat   [] Claudication   [] Pain in feet when walking  [] Pain in feet at rest  [] Pain in feet when laying flat   [] History of DVT   [] Phlebitis   [] Swelling in legs   [] Varicose veins   [] Non-healing ulcers Pulmonary:   [] Uses home oxygen   [] Productive cough    [] Hemoptysis   [] Wheeze  [] COPD   [] Asthma Neurologic:  [] Dizziness  [] Blackouts   [] Seizures   [] History of stroke   [] History of TIA  [] Aphasia   [] Temporary blindness   [] Dysphagia   [] Weakness or numbness in arms   [] Weakness or numbness in legs Musculoskeletal:  [x] Arthritis   [] Joint swelling   [x] Joint pain   [] Low back pain Hematologic:  [] Easy bruising  [] Easy bleeding   [] Hypercoagulable state   [] Anemic  [] Hepatitis Gastrointestinal:  [x] Blood in stool   [] Vomiting blood  [x] Gastroesophageal reflux/heartburn   [] Difficulty swallowing. Genitourinary:  [] Chronic kidney disease   [] Difficult urination  [] Frequent urination  [] Burning with urination   [] Blood in urine Skin:  [] Rashes   [] Ulcers   [] Wounds Psychological:  [] History of anxiety   []  History of major depression.  Physical Examination  Vitals:   10/17/18 1103 10/17/18 1213 10/17/18 1332 10/17/18 1602  BP: (!) 143/94 124/81 129/82 (!) 151/90  Pulse: 83  88 88  Resp: 18  18 19   Temp:   98.6 F (37 C) 98.1 F (36.7 C)  TempSrc:   Oral Oral  SpO2: 99%  98% 98%  Weight:    83.9 kg  Height:    5\' 6"  (1.676 m)   Body mass index is 29.85 kg/m. Gen:  WD/WN, NAD Head: [place]/AT, No temporalis wasting.  Ear/Nose/Throat: Hearing grossly intact, nares w/o erythema or drainage, oropharynx w/o Erythema/Exudate Eyes: Sclera non-icteric, conjunctiva clear Neck: Trachea midline.  No JVD.  Pulmonary:  Good air movement, respirations not labored, equal bilaterally.  Cardiac: RRR, normal S1, S2. Vascular:  Vessel Right Left  Radial Palpable Palpable                                   Gastrointestinal: soft, non-tender/non-distended. No guarding/reflex.  Musculoskeletal: M/S 5/5 throughout.  Extremities without ischemic changes.  No deformity or atrophy. No edema. Neurologic: Sensation grossly intact in extremities.  Symmetrical.  Speech is fluent. Motor exam as listed above. Psychiatric: Judgment intact, Mood & affect  appropriate for pt's clinical situation. Dermatologic: No rashes or ulcers noted.  No cellulitis or open wounds.       CBC Lab Results  Component Value Date   WBC 6.2 10/17/2018   HGB 11.6 (L) 10/17/2018   HCT 39.5 10/17/2018   MCV 87.2 10/17/2018   PLT 182 10/17/2018    BMET    Component Value Date/Time   NA 141 10/17/2018 0813   NA 139 02/16/2014 0522   K 4.1 10/17/2018 0813  K 3.6 02/16/2014 0522   CL 109 10/17/2018 0813   CL 109 (H) 02/16/2014 0522   CO2 23 10/17/2018 0813   CO2 25 02/16/2014 0522   GLUCOSE 131 (H) 10/17/2018 0813   GLUCOSE 101 (H) 02/16/2014 0522   BUN 23 10/17/2018 0813   BUN 12 02/16/2014 0522   CREATININE 1.36 (H) 10/17/2018 0813   CREATININE 1.25 02/16/2014 0522   CALCIUM 9.3 10/17/2018 0813   CALCIUM 8.8 02/16/2014 0522   GFRNONAA 56 (L) 10/17/2018 0813   GFRNONAA >60 02/16/2014 0522   GFRAA >60 10/17/2018 0813   GFRAA >60 02/16/2014 0522   Estimated Creatinine Clearance: 57.9 mL/min (A) (by C-G formula based on SCr of 1.36 mg/dL (H)).  COAG Lab Results  Component Value Date   INR 0.9 10/17/2018   INR 0.96 03/14/2017   INR 0.91 10/27/2015    Radiology Nm Gi Blood Loss  Result Date: 10/17/2018 CLINICAL DATA:  Remote history of GI bleed.  Now with bloody stools. EXAM: NUCLEAR MEDICINE GASTROINTESTINAL BLEEDING SCAN TECHNIQUE: Sequential abdominal images were obtained following intravenous administration of Tc-21m labeled red blood cells. RADIOPHARMACEUTICALS:  20.275 mCi Tc-71m pertechnetate in-vitro labeled red cells. COMPARISON:  None FINDINGS: On the early images there is radiotracer accumulation identified within the proximal right colon. On subsequent imaging there is progressive scratch set on the subsequent images this focus increases in intensity and there is antegrade progression of the radiopharmaceutical conforming to the expected configuration of the colon. IMPRESSION: 1. Exam positive for active GI bleed. Source of bleeding  appears to be the proximal right colon. Electronically Signed   By: Signa Kell M.D.   On: 10/17/2018 13:33      Assessment/Plan 1.  Brisk lower GI bleeding with positive bleeding scan.  I have independently reviewed the bleeding scan and it clearly appears to be coming from the proximal right colon with active extravasation.  This would be consistent with a diverticular bleed and generally would be amenable to endovascular therapy in the form of embolization.  Embolization would have about a 90% success rate with a 1 to 2% risk of major complication such as ischemia.  I have discussed these issues with the patient who is agreeable to proceed and embolization will be performed this afternoon 2.  Diverticulosis.  Likely the cause of bleeding again.  Had a similar episode in 2018 3.  Hypertension.  Stable on outpatient medications and blood pressure control important in reducing the progression of atherosclerotic disease. On appropriate oral medications.    Festus Barren, MD  10/17/2018 4:37 PM    This note was created with Dragon medical transcription system.  Any error is purely unintentional

## 2018-10-17 NOTE — OR Nursing (Signed)
Pt reported difficulty breathing and stomach cramping, restless and hypotensive, oxygen saturations 100%. Started on 100% face mask. Atropine 0.4 mg IV, Normal saline bolus at 999 for 15 minutes. Rapid response called, bu tpt stabilized and Dr Wyn Quaker at bedside. Weaned to 4 liters . NS reduced to 555 ml/hr. FSBS 135.

## 2018-10-17 NOTE — ED Provider Notes (Signed)
[facility] Emergency Department Provider Note  Time seen: 7:53 AM  I have reviewed the triage vital signs and the nursing notes.   HISTORY  Chief Complaint GI Bleeding   HPI Darrell Freeman is a 63 y.o. male with a past medical history of diverticulosis, gastric reflux, hypertension, prior GI bleed, presents to the emergency department for bloody stool.  According to the patient he woke this morning around 530, had a bowel movement and noted some darker appearing stool.  States he had 2 or 3 additional bowel movements this morning and transitioned to a bright red blood per rectum.  No nausea or vomiting.  No abdominal pain.  No fever.  Patient states approximately 2 years ago he had a history of a diverticular bleed at that time.  Patient denies any blood thinner use.  Patient denies any fever cough congestion or shortness of breath.   Past Medical History:  Diagnosis Date  . Arthritis   . Diverticulosis   . GERD (gastroesophageal reflux disease)   . GI bleed   . Hypertension     Patient Active Problem List   Diagnosis Date Noted  . GI bleed 03/15/2017  . HTN (hypertension) 03/15/2017  . GERD (gastroesophageal reflux disease) 03/15/2017    Past Surgical History:  Procedure Laterality Date  . COLONOSCOPY WITH PROPOFOL N/A 03/16/2017   Procedure: COLONOSCOPY WITH PROPOFOL;  Surgeon: Wyline MoodAnna, Kiran, MD;  [facility];  Service: Gastroenterology;  Laterality: N/A;  . ESOPHAGOGASTRODUODENOSCOPY (EGD) WITH PROPOFOL N/A 03/16/2017   Procedure: ESOPHAGOGASTRODUODENOSCOPY (EGD) WITH PROPOFOL;  Surgeon: Wyline MoodAnna, Kiran, MD;  [facility];  Service: Gastroenterology;  Laterality: N/A;  . OLECRANON BURSA EXCISION    . OLECRANON BURSECTOMY Left 12/26/2014   Procedure: OLECRANON BURSA;  Surgeon: Myra Rudehristopher Smith, MD;  Location: [facility] ORS;  Service: Orthopedics;  Laterality: Left;    Prior to Admission medications   Medication Sig Start Date End  Date Taking? Authorizing Provider  allopurinol (ZYLOPRIM) 300 MG tablet Take 300 mg by mouth as needed.    [provider]  atorvastatin (LIPITOR) 10 MG tablet Take 1 tablet by mouth daily. 03/10/17   [provider]  HYDROcodone-acetaminophen (NORCO) 5-325 MG per tablet Take 1 tablet by mouth every 6 (six) hours as needed for moderate pain. Patient not taking: Reported on 03/14/2017 12/26/14   Myra RudeSmith, Christopher, MD  losartan (COZAAR) 50 MG tablet Take 50 mg by mouth daily.     [provider]  pantoprazole (PROTONIX) 40 MG tablet Take 1 tablet (40 mg total) by mouth 2 (two) times daily. 03/16/17 03/16/18  Altamese DillingVachhani, Vaibhavkumar, MD  zolpidem (AMBIEN) 10 MG tablet Take 5-10 mg by mouth at bedtime as needed for sleep.    [provider]    No Known Allergies  No family history on file.  Social History Social History   Tobacco Use  . Smoking status: Former Smoker    Years: 7.00    Types: Cigarettes    Last attempt to quit: 12/11/1984    Years since quitting: 33.8  . Smokeless tobacco: Never Used  Substance Use Topics  . Alcohol use: Yes    Comment: OCC  . Drug use: No    Review of Systems Constitutional: Negative for fever. ENT: Negative for recent illness/congestion Cardiovascular: Negative for chest pain. Respiratory: Negative for shortness of breath. Gastrointestinal: Negative for abdominal pain.  Positive for bright red blood per rectum. Musculoskeletal: Negative for musculoskeletal complaints Skin: Negative for skin complaints  Neurological:  Negative for headache All other ROS negative  ____________________________________________   PHYSICAL EXAM:  VITAL SIGNS: ED Triage Vitals  Enc Vitals Group     BP 10/17/18 0739 (!) 163/102     Pulse Rate 10/17/18 0739 98     Resp 10/17/18 0739 17     Temp 10/17/18 0739 98.2 F (36.8 C)     Temp Source 10/17/18 0739 Oral     SpO2 10/17/18 0739 100 %     Weight 10/17/18 0740 185 lb (83.9  kg)     Height 10/17/18 0740 5\' 6"  (1.676 m)     Head Circumference --      Peak Flow --      Pain Score 10/17/18 0740 0     Pain Loc --      Pain Edu? --      Excl. in GC? --    Constitutional: Alert and oriented. Well appearing and in no distress. Eyes: Normal exam ENT      Head: Normocephalic and atraumatic.      Mouth/Throat: Mucous membranes are moist. Cardiovascular: Normal rate, regular rhythm. Respiratory: Normal respiratory effort without tachypnea nor retractions. Breath sounds are clear  Gastrointestinal: Soft and nontender. No distention.  Rectal exam shows bright red blood. Musculoskeletal: Nontender with normal range of motion in all extremities.  Neurologic:  Normal speech and language. No gross focal neurologic deficits  Skin:  Skin is warm, dry and intact.  Psychiatric: Mood and affect are normal.  ____________________________________________   INITIAL IMPRESSION / ASSESSMENT AND PLAN / ED COURSE  Pertinent labs & imaging results that were available during my care of the patient were reviewed by me and considered in my medical decision making (see chart for details).   Patient presents to the emergency department for bright red blood per rectum.  History of GI bleed 2 years ago due to diverticulosis per patient.  Patient denies any abdominal pain, benign abdominal exam.  Afebrile.  Rectal exam shows gross hematochezia.  We will check labs.  Plan will be to admit the patient to the hospitalist service once labs have resulted.  Patient agreeable plan of care.  Patient's labs have resulted, reassuring, hemoglobin 13.4.  INR 0.9.  Corona test is negative.  Patient will be admitted to the hospital service for further work-up.  Darrell Freeman was evaluated in Emergency Department on 10/17/2018 for the symptoms described in the history of present illness. He was evaluated in the context of the global COVID-19 pandemic, which necessitated consideration that the patient might  be at risk for infection with the SARS-CoV-2 virus that causes COVID-19. Institutional protocols and algorithms that pertain to the evaluation of patients at risk for COVID-19 are in a state of rapid change based on information released by regulatory bodies including the CDC and federal and state organizations. These policies and algorithms were followed during the patient's care in the ED.  ____________________________________________   FINAL CLINICAL IMPRESSION(S) / ED DIAGNOSES  Lower GI bleed   Minna Antis, MD 10/17/18 1057

## 2018-10-17 NOTE — Op Note (Signed)
[facility] Percutaneous Study/Intervention Procedural Note     Surgeon(s): [employer]  Assistants: none  Pre-operative Diagnosis: 1. Lower GI bleed   Post-operative diagnosis: Same  Procedure(s) Performed: 1. Ultrasound guidance for vascular access right femoral artery 2. Catheter placement into ileocolic branch and right colic branch of the superior mesenteric artery 3. Aortogram and selective angiogram of the SMA including imaging of the ileocolic branch and right colic branch of the SMA 4. Microbead embolization of the right colic branch with 0.5 cc of 500-700  polyvinyl alcohol beads and microbead embolization of the ileocolic branch with 1.5 cc of 500 to 700 m polyvinyl alcohol beads. 5. StarClose closure device right femoral artery  Anesthesia: Moderate conscious sedation for approximately 20 minutes using 2 mg of Versed and 50 Mcg of Fentanyl  EBL: 5 cc  Fluoro Time: 4.1 minutes  Contrast: 40 cc  Indications: Patient is a 63 y.o.male with brisk lower GI bleeding with resultant anemia. The patient has a nuclear medicine study showing active extravasation from the proximal right colon. The patient is brought in for angiography for further evaluation and potential treatment. Risks and benefits are discussed and informed consent is obtained  Procedure: The patient was identified and appropriate procedural time out was performed. The patient was then placed supine on the table and prepped and draped in the usual sterile fashion.Moderate conscious sedation was administered during a face to face encounter with the patient throughout the procedure with my supervision of the RN administering medicines and monitoring the patient's vital signs, pulse oximetry, telemetry and mental status throughout from the start of the procedure until the patient was taken to the  recovery room.  Ultrasound was used to evaluate the right common femoral artery. It was patent . A digital ultrasound image was acquired. A Seldinger needle was used to access the right common femoral artery under direct ultrasound guidance and a permanent image was performed. A 0.035 J wire was advanced without resistance and a 5Fr sheath was placed. Pigtail catheter was placed into the aorta and an AP aortogram was performed. This demonstrated a patent left renal artery, aorta and iliac without significant stenosis.  The origins of the SMA and celiac artery were not well seen in the AP projection but they had brisk flow.  The right renal artery was not well seen and it was not clear if this was because of the overlying SMA or an occlusion.  A C2 catheter was used to selectively cannulate the superior mesenteric artery.  This demonstrated a typical course with 2 vessels that appeared to be feeding the right colon.  1 was a right colic branch coming off in the mid SMA.  The ileocolic branch was 1 of the terminal branches of the SMA and started coursing leftward before making a swing down to the right lower quadrant and filling the ileocolic portions of the bowel.  Both arteries were brisk although true active extravasation was not well seen on the angiogram.  This did correlate with the findings of the expected location from the nuclear medicine study.  Based on his continued bleeding and the nuclear medicine study I elected to treat this area with embolization. I initially advanced the Pro-Great microcatheter out the right colic branch and instilled approximately 0.5 cc of 500 to 700 m polyvinyl alcohol beads in this location. Angiogram following this showed the main vessels to be open with less brisk filling. I then pulled back and cannulated the ileocolic branch with  the Pro-great microcatheter without difficulty.  This was the larger branch and seemed to feel more of the right colon and ileocolic area.   Selective imaging was performed which showed brisk flow.  A total of 1.5 cc of 500 to 700 m polyvinyl alcohol beads were deployed in the ileocolic branch. Again, completion angiogram showed the main vessels to be open with less brisk filling. I elected to terminate the procedure. The diagnostic catheter was removed. StarClose closure device was deployed in usual fashion with excellent hemostatic result. The patient was taken to the recovery room in stable condition having tolerated the procedure well.     Findings:2 branches feeding the right colon and ileocolic area.  Both were embolized with 500 to 700 m polyvinyl alcohol beads.  Disposition: Patient was taken to the recovery room in stable condition having tolerated the procedure well.  Complications: None  Festus BarrenJason Dew 10/17/2018 5:19 PM   This note was created with Dragon Medical transcription system. Any errors in dictation are purely unintentional.

## 2018-10-17 NOTE — ED Triage Notes (Signed)
Pt c/o having 3 large dark bloody stools with clots today. Denies any abd pain. States he has a hx of diverticulitis.

## 2018-10-17 NOTE — H&P (Signed)
[facility] [place] [facility]   PATIENT NAME: Darrell Freeman    MR#:  [id number]  DATE OF BIRTH:  04-27-1956  DATE OF ADMISSION:  10/17/2018  PRIMARY CARE PHYSICIAN: Mick Sell, MD   REQUESTING/REFERRING PHYSICIAN: dr p  CHIEF COMPLAINT:   Rectal bleeding HISTORY OF PRESENT ILLNESS:  Darrell Freeman  is a 63 y.o. male with a known history of diverticular bleed and HTN who presents to ED with rectal bleeding with large blood clots.He has had three large blood BM here in the ED.  He denies abdominal pain, fever or chills. He took Ibuprofen twice in thee past month. He was lifting heavy weights yesterday then had bleeding last night. Last colonscopy was about 2 years ago when he had diverticular bleed.   PAST MEDICAL HISTORY:   Past Medical History:  Diagnosis Date  . Arthritis   . Diverticulosis   . GERD (gastroesophageal reflux disease)   . GI bleed   . Hypertension     PAST SURGICAL HISTORY:   Past Surgical History:  Procedure Laterality Date  . COLONOSCOPY WITH PROPOFOL N/A 03/16/2017   Procedure: COLONOSCOPY WITH PROPOFOL;  Surgeon: Wyline Mood, MD;  Location: [facility] ENDOSCOPY;  Service: Gastroenterology;  Laterality: N/A;  . ESOPHAGOGASTRODUODENOSCOPY (EGD) WITH PROPOFOL N/A 03/16/2017   Procedure: ESOPHAGOGASTRODUODENOSCOPY (EGD) WITH PROPOFOL;  Surgeon: Wyline Mood, MD;  Location: [facility] ENDOSCOPY;  Service: Gastroenterology;  Laterality: N/A;  . OLECRANON BURSA EXCISION    . OLECRANON BURSECTOMY Left 12/26/2014   Procedure: OLECRANON BURSA;  Surgeon: Myra Rude, MD;  Location: [facility] ORS;  Service: Orthopedics;  Laterality: Left;    SOCIAL HISTORY:   Social History   Tobacco Use  . Smoking status: Former Smoker    Years: 7.00    Types: Cigarettes    Last attempt to quit: 12/11/1984    Years since quitting: 33.8  . Smokeless tobacco: Never Used  Substance Use Topics  . Alcohol use: Yes    Comment: OCC    FAMILY HISTORY:   No family history on file.  DRUG ALLERGIES:  No Known Allergies  REVIEW OF SYSTEMS:   Review of Systems  Constitutional: Negative.  Negative for chills, fever and malaise/fatigue.  HENT: Negative.  Negative for ear discharge, ear pain, hearing loss, nosebleeds and sore throat.   Eyes: Negative.  Negative for blurred vision and pain.  Respiratory: Negative.  Negative for cough, hemoptysis, shortness of breath and wheezing.   Cardiovascular: Negative.  Negative for chest pain, palpitations and leg swelling.  Gastrointestinal: Positive for blood in stool. Negative for abdominal pain, diarrhea, nausea and vomiting.  Genitourinary: Negative.  Negative for dysuria.  Musculoskeletal: Negative.  Negative for back pain.  Skin: Negative.   Neurological: Negative for dizziness, tremors, speech change, focal weakness, seizures and headaches.  Endo/Heme/Allergies: Negative.  Does not bruise/bleed easily.  Psychiatric/Behavioral: Negative.  Negative for depression, hallucinations and suicidal ideas.    MEDICATIONS AT HOME:   Prior to Admission medications   Medication Sig Start Date End Date Taking? Authorizing Provider  allopurinol (ZYLOPRIM) 300 MG tablet Take 300 mg by mouth as needed.   Yes [provider]  indomethacin (INDOCIN) 25 MG capsule Take 25 mg by mouth 2 (two) times daily as needed for pain. 07/25/18  Yes [provider]  losartan (COZAAR) 50 MG tablet Take 50 mg by mouth daily.    Yes [provider]  pantoprazole (PROTONIX) 40 MG tablet Take 1 tablet (40 mg total) by mouth 2 (  two) times daily. 03/16/17 10/17/18 Yes Altamese DillingVachhani, Vaibhavkumar, MD      VITAL SIGNS:  Blood pressure (!) 143/94, pulse 83, temperature 98.2 F (36.8 C), temperature source Oral, resp. rate 18, height 5\' 6"  (1.676 m), weight 83.9 kg, SpO2 99 %.  PHYSICAL EXAMINATION:   Physical Exam Constitutional:      General: He is not in acute distress. HENT:     Head: Normocephalic.   Eyes:     General: No scleral icterus. Neck:     Musculoskeletal: Normal range of motion and neck supple.     Vascular: No JVD.     Trachea: No tracheal deviation.  Cardiovascular:     Rate and Rhythm: Normal rate and regular rhythm.     Heart sounds: Normal heart sounds. No murmur. No friction rub. No gallop.   Pulmonary:     Effort: Pulmonary effort is normal. No respiratory distress.     Breath sounds: Normal breath sounds. No wheezing or rales.  Chest:     Chest wall: No tenderness.  Abdominal:     General: Bowel sounds are normal. There is no distension.     Palpations: Abdomen is soft. There is no mass.     Tenderness: There is no abdominal tenderness. There is no guarding or rebound.  Musculoskeletal: Normal range of motion.  Skin:    General: Skin is warm.     Findings: No erythema or rash.  Neurological:     Mental Status: He is alert and oriented to person, place, and time.  Psychiatric:        Judgment: Judgment normal.       LABORATORY PANEL:   CBC Recent Labs  Lab 10/17/18 0813  WBC 6.2  HGB 13.4  HCT 39.5  PLT 182   ------------------------------------------------------------------------------------------------------------------  Chemistries  Recent Labs  Lab 10/17/18 0813  NA 141  K 4.1  CL 109  CO2 23  GLUCOSE 131*  BUN 23  CREATININE 1.36*  CALCIUM 9.3  AST 24  ALT 23  ALKPHOS 68  BILITOT 0.7   ------------------------------------------------------------------------------------------------------------------  Cardiac Enzymes No results for input(s): TROPONINI in the last 168 hours. ------------------------------------------------------------------------------------------------------------------  RADIOLOGY:  No results found.  EKG:   Orders placed or performed during the hospital encounter of 10/27/15  . EKG 12-Lead  . EKG 12-Lead  . ED EKG  . ED EKG  . EKG    IMPRESSION AND PLAN:   63 year old make with HTN and  diverticular bleed who presents to ED with rectal bleeding.  1. LGIB with concern for diverticular bleed. Bleeding scan ordered GI consult via Epic Dr Norma Fredricksonoledo. PPI IV BID Hemoglobin q 6h and transfuse if Hgb <7. NPO with IVF for now.    2. HTN: Continue Losrtan  3. AKI: Start IVF and monitor creatinine.    All the records are reviewed and case discussed with ED provider. Management plans discussed with the patient and he in agreement  CODE STATUS: full  TOTAL TIME TAKING CARE OF THIS PATIENT: 41 minutes.    Adrian SaranSital Mishti Swanton M.D on 10/17/2018 at 11:05 AM  Between 7am to 6pm - Pager - [phone]  After 6pm go to [email or web address] - Social research officer, governmentpassword EPAS [facility]  [facility]  Office  [phone]  CC: Primary care physician; Mick SellFitzgerald, David P, MD

## 2018-10-17 NOTE — ED Notes (Addendum)
ED TO INPATIENT HANDOFF REPORT  ED Nurse Name and Phone #: [phone] Britt Boozer Name/Age/Gender Darrell Freeman 63 y.o. male Room/Bed: ED05A/ED05A  Code Status   Code Status: Prior  Home/SNF/Other Home Patient oriented to: self, place, time and situation Is this baseline? Yes   Triage Complete: Triage complete  Chief Complaint rectal bleeding  Triage Note Pt c/o having 3 large dark bloody stools with clots today. Denies any abd pain. States he has a hx of diverticulitis.    Allergies No Known Allergies  Level of Care/Admitting Diagnosis ED Disposition    ED Disposition Condition Comment   Admit  Hospital Area: [facility] [[id number]]  Level of Care: Med-Surg [16]  Covid Evaluation: N/A  Diagnosis: GIB (gastrointestinal bleeding) [914782]  Admitting Physician: Adrian Saran [[id number]]  Attending Physician: MODY, Patricia Pesa [[id number]]  Estimated length of stay: 3 - 4 days  Certification:: I certify this patient will need inpatient services for at least 2 midnights  PT Class (Do Not Modify): Inpatient [101]  PT Acc Code (Do Not Modify): Private [1]       B Medical/Surgery History Past Medical History:  Diagnosis Date  . Arthritis   . Diverticulosis   . GERD (gastroesophageal reflux disease)   . GI bleed   . Hypertension    Past Surgical History:  Procedure Laterality Date  . COLONOSCOPY WITH PROPOFOL N/A 03/16/2017   Procedure: COLONOSCOPY WITH PROPOFOL;  Surgeon: Wyline Mood, MD;  Location: [facility] ENDOSCOPY;  Service: Gastroenterology;  Laterality: N/A;  . ESOPHAGOGASTRODUODENOSCOPY (EGD) WITH PROPOFOL N/A 03/16/2017   Procedure: ESOPHAGOGASTRODUODENOSCOPY (EGD) WITH PROPOFOL;  Surgeon: Wyline Mood, MD;  Location: [facility] ENDOSCOPY;  Service: Gastroenterology;  Laterality: N/A;  . OLECRANON BURSA EXCISION    . OLECRANON BURSECTOMY Left 12/26/2014   Procedure: OLECRANON BURSA;  Surgeon: Myra Rude, MD;  Location: [facility] ORS;  Service: Orthopedics;   Laterality: Left;     A IV Location/Drains/Wounds Patient Lines/Drains/Airways Status   Active Line/Drains/Airways    Name:   Placement date:   Placement time:   Site:   Days:   Peripheral IV 10/17/18 Right Antecubital   10/17/18    0813    Antecubital   less than 1   Airway   03/16/17    1328     580          Intake/Output Last 24 hours No intake or output data in the 24 hours ending 10/17/18 1136  Labs/Imaging Results for orders placed or performed during the hospital encounter of 10/17/18 (from the past 48 hour(s))  CBC     Status: None   Collection Time: 10/17/18  8:13 AM  Result Value Ref Range   WBC 6.2 4.0 - 10.5 K/uL   RBC 4.53 4.22 - 5.81 MIL/uL   Hemoglobin 13.4 13.0 - 17.0 g/dL   HCT 46.9 62.9 - 52.8 %   MCV 87.2 80.0 - 100.0 fL   MCH 29.6 26.0 - 34.0 pg   MCHC 33.9 30.0 - 36.0 g/dL   RDW 41.3 24.4 - 01.0 %   Platelets 182 150 - 400 K/uL   nRBC 0.0 0.0 - 0.2 %    Comment: Performed at [facility], [address] [place], [place] [address]  Comprehensive metabolic panel     Status: Abnormal   Collection Time: 10/17/18  8:13 AM  Result Value Ref Range   Sodium 141 135 - 145 mmol/L   Potassium 4.1 3.5 - 5.1 mmol/L   Chloride 109 98 -  111 mmol/L   CO2 23 22 - 32 mmol/L   Glucose, Bld 131 (H) 70 - 99 mg/dL   BUN 23 8 - 23 mg/dL   Creatinine, Ser 1.611.36 (H) 0.61 - 1.24 mg/dL   Calcium 9.3 8.9 - 09.610.3 mg/dL   Total Protein 7.0 6.5 - 8.1 g/dL   Albumin 3.9 3.5 - 5.0 g/dL   AST 24 15 - 41 U/L   ALT 23 0 - 44 U/L   Alkaline Phosphatase 68 38 - 126 U/L   Total Bilirubin 0.7 0.3 - 1.2 mg/dL   GFR calc non Af Amer 56 (L) >60 mL/min   GFR calc Af Amer >60 >60 mL/min   Anion gap 9 5 - 15    Comment: Performed at [facility], [address] [place], [place] [address]  Protime-INR     Status: None   Collection Time: 10/17/18  8:13 AM  Result Value Ref Range   Prothrombin Time 12.1 11.4 - 15.2 seconds   INR 0.9 0.8 - 1.2    Comment:  (NOTE) INR goal varies based on device and disease states. Performed at [facility], [address] [place], [place] [address]   Type and screen [facility]     Status: None   Collection Time: 10/17/18  8:13 AM  Result Value Ref Range   ABO/RH(D) A POS    Antibody Screen NEG    Sample Expiration      10/20/2018,2359 Performed at [facility], [address] [place], [place] [address]   SARS Coronavirus 2 (CEPHEID - Performed in [facility]), Hosp Order     Status: None   Collection Time: 10/17/18  9:14 AM  Result Value Ref Range   SARS Coronavirus 2 NEGATIVE NEGATIVE    Comment: (NOTE) If result is NEGATIVE SARS-CoV-2 target nucleic acids are NOT DETECTED. The SARS-CoV-2 RNA is generally detectable in upper and lower  respiratory specimens during the acute phase of infection. The lowest  concentration of SARS-CoV-2 viral copies this assay can detect is 250  copies / mL. A negative result does not preclude SARS-CoV-2 infection  and should not be used as the sole basis for treatment or other  patient management decisions.  A negative result may occur with  improper specimen collection / handling, submission of specimen other  than nasopharyngeal swab, presence of viral mutation(s) within the  areas targeted by this assay, and inadequate number of viral copies  (<250 copies / mL). A negative result must be combined with clinical  observations, patient history, and epidemiological information. If result is POSITIVE SARS-CoV-2 target nucleic acids are DETECTED. The SARS-CoV-2 RNA is generally detectable in upper and lower  respiratory specimens dur ing the acute phase of infection.  Positive  results are indicative of active infection with SARS-CoV-2.  Clinical  correlation with patient history and other diagnostic information is  necessary to determine patient infection status.  Positive results do  not rule out bacterial  infection or co-infection with other viruses. If result is PRESUMPTIVE POSTIVE SARS-CoV-2 nucleic acids MAY BE PRESENT.   A presumptive positive result was obtained on the submitted specimen  and confirmed on repeat testing.  While 2019 novel coronavirus  (SARS-CoV-2) nucleic acids may be present in the submitted sample  additional confirmatory testing may be necessary for epidemiological  and / or clinical management purposes  to differentiate between  SARS-CoV-2 and other Sarbecovirus currently known to infect humans.  If clinically  indicated additional testing with an alternate test  methodology [phone]) is advised. The SARS-CoV-2 RNA is generally  detectable in upper and lower respiratory sp ecimens during the acute  phase of infection. The expected result is Negative. Fact Sheet for Patients:  BoilerBrush.com.cy Fact Sheet for Healthcare Providers: https://pope.com/ This test is not yet approved or cleared by the [place] FDA and has been authorized for detection and/or diagnosis of SARS-CoV-2 by FDA under an Emergency Use Authorization (EUA).  This EUA will remain in effect (meaning this test can be used) for the duration of the COVID-19 declaration under Section 564(b)(1) of the Act, 21 U.S.C. section 360bbb-3(b)(1), unless the authorization is terminated or revoked sooner. Performed at [facility], [address] [place], [place] [address]    No results found.  Pending Labs Unresulted Labs (From admission, onward)    Start     Ordered   10/17/18 1106  Hemoglobin  Now then every 6 hours,   STAT     10/17/18 1105   Signed and Held  HIV antibody (Routine Testing)  Once,   R     Signed and Held   Signed and Held  Basic metabolic panel  Tomorrow morning,   R     Signed and Held   Signed and Held  CBC  Tomorrow morning,   R     Signed and Held          Vitals/Pain Today's Vitals   10/17/18 0910  10/17/18 1007 10/17/18 1030 10/17/18 1103  BP:   (!) 149/101 (!) 143/94  Pulse:   75 83  Resp:    18  Temp:      TempSrc:      SpO2:   98% 99%  Weight:      Height:      PainSc: 0-No pain 0-No pain  0-No pain    Isolation Precautions No active isolations  Medications Medications - No data to display  Mobility walks Low fall risk   Focused Assessments GI Bleeding   R Recommendations: See Admitting Provider Note  Report given to: Marisue Ivan, RN  Additional Notes:

## 2018-10-18 ENCOUNTER — Encounter: Payer: Self-pay | Admitting: Vascular Surgery

## 2018-10-18 ENCOUNTER — Inpatient Hospital Stay: Payer: Managed Care, Other (non HMO)

## 2018-10-18 DIAGNOSIS — Z9889 Other specified postprocedural states: Secondary | ICD-10-CM

## 2018-10-18 LAB — BASIC METABOLIC PANEL
Anion gap: 7 (ref 5–15)
BUN: 19 mg/dL (ref 8–23)
CO2: 20 mmol/L — ABNORMAL LOW (ref 22–32)
Calcium: 8.4 mg/dL — ABNORMAL LOW (ref 8.9–10.3)
Chloride: 113 mmol/L — ABNORMAL HIGH (ref 98–111)
Creatinine, Ser: 1.2 mg/dL (ref 0.61–1.24)
GFR calc Af Amer: >60 mL/min (ref >60)
GFR calc non Af Amer: >60 mL/min (ref >60)
Glucose, Bld: 112 mg/dL — ABNORMAL HIGH (ref 70–99)
Potassium: 3.8 mmol/L (ref 3.5–5.1)
Sodium: 140 mmol/L (ref 135–145)

## 2018-10-18 LAB — CBC
HCT: 28.9 % — ABNORMAL LOW (ref 39.0–52.0)
Hemoglobin: 9.7 g/dL — ABNORMAL LOW (ref 13.0–17.0)
MCH: 29.5 pg (ref 26.0–34.0)
MCHC: 33.6 g/dL (ref 30.0–36.0)
MCV: 87.8 fL (ref 80.0–100.0)
Platelets: 147 10*3/uL — ABNORMAL LOW (ref 150–400)
RBC: 3.29 MIL/uL — ABNORMAL LOW (ref 4.22–5.81)
RDW: 12.7 % (ref 11.5–15.5)
WBC: 5.7 10*3/uL (ref 4.0–10.5)
nRBC: 0 % (ref 0.0–0.2)

## 2018-10-18 LAB — HEMOGLOBIN
Hemoglobin: 9.7 g/dL — ABNORMAL LOW (ref 13.0–17.0)
Hemoglobin: 9.3 g/dL — ABNORMAL LOW (ref 13.0–17.0)

## 2018-10-18 MED ORDER — OXYCODONE-ACETAMINOPHEN 5-325 MG PO TABS
1.0000 | ORAL_TABLET | Freq: Four times a day (QID) | ORAL | Status: DC | PRN
  Administered 2018-10-18: 05:00:00 1 via ORAL
  Filled 2018-10-18: qty 1

## 2018-10-18 NOTE — Progress Notes (Signed)
[facility]  Daily Progress Note   Subjective  - 1 Day Post-Op  Did well overnight.  Had mesenteric embolization yesterday evening with no further bleeding over the past 10 to 12 hours.  Had a couple of outputs last night.  2 episodes of abdominal pain last night but really no further pain today.  No signs of ongoing bleeding  Objective Vitals:   10/17/18 1855 10/17/18 1932 10/17/18 2057 10/18/18 0409  BP: 104/73 119/77 103/73 114/67  Pulse: 76 80 87 73  Resp: 12 20 20 20   Temp: (!) 97.5 F (36.4 C) 97.9 F (36.6 C) 98.1 F (36.7 C) 98.2 F (36.8 C)  TempSrc: Oral Oral Oral Oral  SpO2: 100% 100% 99% 98%  Weight:      Height:        Intake/Output Summary (Last 24 hours) at 10/18/2018 1010 Last data filed at 10/18/2018 0915 Gross per 24 hour  Intake 2073.1 ml  Output 900 ml  Net 1173.1 ml    PULM  CTAB CV  RRR VASC  access site without hematoma ABD     soft, nontender  Laboratory CBC    Component Value Date/Time   WBC 5.7 10/18/2018 0504   HGB 9.7 (L) 10/18/2018 0504   HGB 11.4 (L) 02/16/2014 0522   HCT 28.9 (L) 10/18/2018 0504   HCT 34.8 (L) 02/16/2014 0522   PLT 147 (L) 10/18/2018 0504   PLT 148 (L) 02/16/2014 0522    BMET    Component Value Date/Time   NA 140 10/18/2018 0504   NA 139 02/16/2014 0522   K 3.8 10/18/2018 0504   K 3.6 02/16/2014 0522   CL 113 (H) 10/18/2018 0504   CL 109 (H) 02/16/2014 0522   CO2 20 (L) 10/18/2018 0504   CO2 25 02/16/2014 0522   GLUCOSE 112 (H) 10/18/2018 0504   GLUCOSE 101 (H) 02/16/2014 0522   BUN 19 10/18/2018 0504   BUN 12 02/16/2014 0522   CREATININE 1.20 10/18/2018 0504   CREATININE 1.25 02/16/2014 0522   CALCIUM 8.4 (L) 10/18/2018 0504   CALCIUM 8.8 02/16/2014 0522   GFRNONAA >60 10/18/2018 0504   GFRNONAA >60 02/16/2014 0522   GFRAA >60 10/18/2018 0504   GFRAA >60 02/16/2014 0522    Assessment/Planning: POD #1 s/p mesenteric embolization   Doing well.  No more passing of blood.   Hemoglobin is stable  Mild abdominal pain which is expected after embolization.  Okay to advance diet at this point.  If tolerates diet, likely will be stable for discharge this evening or tomorrow  Can follow-up with [place] in the office in 2 to 3 weeks.    Festus Barren  10/18/2018, 10:10 AM

## 2018-10-18 NOTE — Progress Notes (Signed)
[facility] Inpatient Progress Note  Subjective: Patient seen for follow-up of gastrointestinal bleeding.  Events noted with excellent hemostasis via mesenteric angiography per Dr. Wyn Quakerew.  Patient denies any abdominal pain or further bleeding other than old black blood in the stool.  Objective: Vital signs in last 24 hours: Temp:  [97.5 F (36.4 C)-98.6 F (37 C)] 98.2 F (36.8 C) (05/14 0409) Pulse Rate:  [43-96] 73 (05/14 0409) Resp:  [10-21] 20 (05/14 0409) BP: (55-163)/(33-124) 114/67 (05/14 0409) SpO2:  [97 %-100 %] 98 % (05/14 0409) Weight:  [83.9 kg] 83.9 kg (05/13 1602) Blood pressure 114/67, pulse 73, temperature 98.2 F (36.8 C), temperature source Oral, resp. rate 20, height 5\' 6"  (1.676 m), weight 83.9 kg, SpO2 98 %.    Intake/Output from previous day: 05/13 0701 - 05/14 0700 In: 118 [P.O.:118] Out: 650 [Urine:650]  Intake/Output this shift: Total I/O In: 1428.5 [I.V.:1428.5] Out: -    General appearance: Alert no distress Resp: Clear to auscultation Cardio: Regular rate no gallop GI: Soft, benign, no tenderness.  No masses.  Bowel sounds positive Extremities: Edema or atrophy noted.   Lab Results: Results for orders placed or performed during the hospital encounter of 10/17/18 (from the past 24 hour(s))  SARS Coronavirus 2 (CEPHEID - Performed in [facility] hospital lab), Hosp Order     Status: None   Collection Time: 10/17/18  9:14 AM  Result Value Ref Range   SARS Coronavirus 2 NEGATIVE NEGATIVE  Hemoglobin     Status: Abnormal   Collection Time: 10/17/18  2:16 PM  Result Value Ref Range   Hemoglobin 11.6 (L) 13.0 - 17.0 g/dL  Glucose, capillary     Status: Abnormal   Collection Time: 10/17/18  5:35 PM  Result Value Ref Range   Glucose-Capillary 135 (H) 70 - 99 mg/dL  Hemoglobin     Status: Abnormal   Collection Time: 10/17/18  8:18 PM  Result Value Ref Range   Hemoglobin 10.3 (L) 13.0 - 17.0 g/dL  Hemoglobin     Status:  Abnormal   Collection Time: 10/18/18  2:08 AM  Result Value Ref Range   Hemoglobin 9.7 (L) 13.0 - 17.0 g/dL  Basic metabolic panel     Status: Abnormal   Collection Time: 10/18/18  5:04 AM  Result Value Ref Range   Sodium 140 135 - 145 mmol/L   Potassium 3.8 3.5 - 5.1 mmol/L   Chloride 113 (H) 98 - 111 mmol/L   CO2 20 (L) 22 - 32 mmol/L   Glucose, Bld 112 (H) 70 - 99 mg/dL   BUN 19 8 - 23 mg/dL   Creatinine, Ser 9.141.20 0.61 - 1.24 mg/dL   Calcium 8.4 (L) 8.9 - 10.3 mg/dL   GFR calc non Af Amer >60 >60 mL/min   GFR calc Af Amer >60 >60 mL/min   Anion gap 7 5 - 15  CBC     Status: Abnormal   Collection Time: 10/18/18  5:04 AM  Result Value Ref Range   WBC 5.7 4.0 - 10.5 K/uL   RBC 3.29 (L) 4.22 - 5.81 MIL/uL   Hemoglobin 9.7 (L) 13.0 - 17.0 g/dL   HCT 78.228.9 (L) 95.639.0 - 21.352.0 %   MCV 87.8 80.0 - 100.0 fL   MCH 29.5 26.0 - 34.0 pg   MCHC 33.6 30.0 - 36.0 g/dL   RDW 08.612.7 57.811.5 - 46.915.5 %   Platelets 147 (L) 150 - 400 K/uL   nRBC 0.0 0.0 - 0.2 %  Recent Labs    10/17/18 0813  10/17/18 2018 10/18/18 0208 10/18/18 0504  WBC 6.2  --   --   --  5.7  HGB 13.4   < > 10.3* 9.7* 9.7*  HCT 39.5  --   --   --  28.9*  PLT 182  --   --   --  147*   < > = values in this interval not displayed.   BMET Recent Labs    10/17/18 0813 10/18/18 0504  NA 141 140  K 4.1 3.8  CL 109 113*  CO2 23 20*  GLUCOSE 131* 112*  BUN 23 19  CREATININE 1.36* 1.20  CALCIUM 9.3 8.4*   LFT Recent Labs    10/17/18 0813  PROT 7.0  ALBUMIN 3.9  AST 24  ALT 23  ALKPHOS 68  BILITOT 0.7   PT/INR Recent Labs    10/17/18 0813  LABPROT 12.1  INR 0.9   Hepatitis Panel No results for input(s): HEPBSAG, HCVAB, HEPAIGM, HEPBIGM in the last 72 hours. C-Diff No results for input(s): CDIFFTOX in the last 72 hours. No results for input(s): CDIFFPCR in the last 72 hours.   Studies/Results: Dg Chest 1 View  Result Date: 10/18/2018 CLINICAL DATA:  Shortness of breath. EXAM: CHEST  1 VIEW  COMPARISON:  Chest CT 02/20/2006 FINDINGS: Upper normal heart size. Bilateral hilar prominence consistent with adenopathy as seen on prior chest CT. Mild prominence of the right paratracheal stripe. No pulmonary edema, large pleural effusion or pneumothorax. No focal airspace disease. IMPRESSION: 1. No acute chest findings. 2. Chronic bilateral hilar prominence secondary to adenopathy, not significantly changed dating back to 2007 CT. Electronically Signed   By: Narda Rutherford M.D.   On: 10/18/2018 03:22   Nm Gi Blood Loss  Result Date: 10/17/2018 CLINICAL DATA:  Remote history of GI bleed.  Now with bloody stools. EXAM: NUCLEAR MEDICINE GASTROINTESTINAL BLEEDING SCAN TECHNIQUE: Sequential abdominal images were obtained following intravenous administration of Tc-7m labeled red blood cells. RADIOPHARMACEUTICALS:  20.275 mCi Tc-81m pertechnetate in-vitro labeled red cells. COMPARISON:  None FINDINGS: On the early images there is radiotracer accumulation identified within the proximal right colon. On subsequent imaging there is progressive scratch set on the subsequent images this focus increases in intensity and there is antegrade progression of the radiopharmaceutical conforming to the expected configuration of the colon. IMPRESSION: 1. Exam positive for active GI bleed. Source of bleeding appears to be the proximal right colon. Electronically Signed   By: Signa Kell M.D.   On: 10/17/2018 13:33    Scheduled Inpatient Medications:   . losartan  50 mg Oral Daily  . pantoprazole (PROTONIX) IV  40 mg Intravenous Q12H    Continuous Inpatient Infusions:   . sodium chloride 100 mL/hr at 10/18/18 0300    PRN Inpatient Medications:  acetaminophen **OR** acetaminophen, ondansetron **OR** ondansetron (ZOFRAN) IV, oxyCODONE-acetaminophen, polyethylene glycol    Assessment:  #1.  Anemia secondary to gastrointestinal blood loss-hemoglobin is stable. 2.  Presumed diverticular bleed status post  angiography with interventional hemostasis-patient is doing well clinically.  Plan:  #1.  Further recommendation per Dr. Wyn Quaker, whose management is greatly appreciated.. 2.  Call us back if needed.  Marquavious Nazar K. Norma Fredrickson, M.D. 10/18/2018, 9:00 AM

## 2018-10-18 NOTE — Discharge Summary (Signed)
[facility] [place] [facility]   PATIENT NAME: Darrell Freeman    MR#:  [id number]  DATE OF BIRTH:  02/10/1956  DATE OF ADMISSION:  10/17/2018 ADMITTING PHYSICIAN: Adrian Saran, MD  DATE OF DISCHARGE: 10/18/2018  PRIMARY CARE PHYSICIAN: Mick Sell, MD    ADMISSION DIAGNOSIS:  Lower GI bleed [K92.2]  DISCHARGE DIAGNOSIS:  Active Problems:   GIB (gastrointestinal bleeding)   SECONDARY DIAGNOSIS:   Past Medical History:  Diagnosis Date  . Arthritis   . Diverticulosis   . GERD (gastroesophageal reflux disease)   . GI bleed   . Hypertension     HOSPITAL COURSE:    63 year old male with history of hypertension who presented to the emergency room due to rectal bleeding.  1.  Lower GI bleed: GI bleeding scan was positive.  Patient is postoperative day #1 post mesenteric embolization. He has no evidence of further bleeding.  Hemoglobin has remained stable.  Patient tolerating diet.  Patient with outpatient follow-up with vascular surgery.  2.  Essential hypertension: Continue losartan  3.  Acute kidney injury which is resolved with IV fluids.   DISCHARGE CONDITIONS AND DIET:   Stable for discharge cardiac diet  CONSULTS OBTAINED:  Treatment Team:  Stanton Kidney, MD  DRUG ALLERGIES:  No Known Allergies  DISCHARGE MEDICATIONS:   Allergies as of 10/18/2018   No Known Allergies     Medication List    STOP taking these medications   indomethacin 25 MG capsule Commonly known as:  INDOCIN     TAKE these medications   allopurinol 300 MG tablet Commonly known as:  ZYLOPRIM Take 300 mg by mouth as needed.   losartan 50 MG tablet Commonly known as:  COZAAR Take 50 mg by mouth daily.   pantoprazole 40 MG tablet Commonly known as:  Protonix Take 1 tablet (40 mg total) by mouth 2 (two) times daily.         Today   CHIEF COMPLAINT:  Patient doing well this morning.  No acute events overnight.  No more further  bleeding.   VITAL SIGNS:  Blood pressure 114/67, pulse 73, temperature 98.2 F (36.8 C), temperature source Oral, resp. rate 20, height 5\' 6"  (1.676 m), weight 83.9 kg, SpO2 98 %.   REVIEW OF SYSTEMS:  Review of Systems  Constitutional: Negative.  Negative for chills, fever and malaise/fatigue.  HENT: Negative.  Negative for ear discharge, ear pain, hearing loss, nosebleeds and sore throat.   Eyes: Negative.  Negative for blurred vision and pain.  Respiratory: Negative.  Negative for cough, hemoptysis, shortness of breath and wheezing.   Cardiovascular: Negative.  Negative for chest pain, palpitations and leg swelling.  Gastrointestinal: Negative.  Negative for abdominal pain, blood in stool, diarrhea, nausea and vomiting.  Genitourinary: Negative.  Negative for dysuria.  Musculoskeletal: Negative.  Negative for back pain.  Skin: Negative.   Neurological: Negative for dizziness, tremors, speech change, focal weakness, seizures and headaches.  Endo/Heme/Allergies: Negative.  Does not bruise/bleed easily.  Psychiatric/Behavioral: Negative.  Negative for depression, hallucinations and suicidal ideas.     PHYSICAL EXAMINATION:  GENERAL:  63 y.o.-year-old patient lying in the bed with no acute distress.  NECK:  Supple, no jugular venous distention. No thyroid enlargement, no tenderness.  LUNGS: Normal breath sounds bilaterally, no wheezing, rales,rhonchi  No use of accessory muscles of respiration.  CARDIOVASCULAR: S1, S2 normal. No murmurs, rubs, or gallops.  ABDOMEN: Soft, non-tender, non-distended. Bowel sounds present. No organomegaly or mass.  EXTREMITIES: No pedal edema, cyanosis, or clubbing.  PSYCHIATRIC: The patient is alert and oriented x 3.  SKIN: No obvious rash, lesion, or ulcer.   DATA REVIEW:   CBC Recent Labs  Lab 10/18/18 0504  WBC 5.7  HGB 9.7*  HCT 28.9*  PLT 147*    Chemistries  Recent Labs  Lab 10/17/18 0813 10/18/18 0504  NA 141 140  K 4.1 3.8   CL 109 113*  CO2 23 20*  GLUCOSE 131* 112*  BUN 23 19  CREATININE 1.36* 1.20  CALCIUM 9.3 8.4*  AST 24  --   ALT 23  --   ALKPHOS 68  --   BILITOT 0.7  --     Cardiac Enzymes No results for input(s): TROPONINI in the last 168 hours.  Microbiology Results  @  RADIOLOGY:  Dg Chest 1 View  Result Date: 10/18/2018 CLINICAL DATA:  Shortness of breath. EXAM: CHEST  1 VIEW COMPARISON:  Chest CT 02/20/2006 FINDINGS: Upper normal heart size. Bilateral hilar prominence consistent with adenopathy as seen on prior chest CT. Mild prominence of the right paratracheal stripe. No pulmonary edema, large pleural effusion or pneumothorax. No focal airspace disease. IMPRESSION: 1. No acute chest findings. 2. Chronic bilateral hilar prominence secondary to adenopathy, not significantly changed dating back to 2007 CT. Electronically Signed   By: Narda Rutherford M.D.   On: 10/18/2018 03:22   Nm Gi Blood Loss  Result Date: 10/17/2018 CLINICAL DATA:  Remote history of GI bleed.  Now with bloody stools. EXAM: NUCLEAR MEDICINE GASTROINTESTINAL BLEEDING SCAN TECHNIQUE: Sequential abdominal images were obtained following intravenous administration of Tc-69m labeled red blood cells. RADIOPHARMACEUTICALS:  20.275 mCi Tc-30m pertechnetate in-vitro labeled red cells. COMPARISON:  None FINDINGS: On the early images there is radiotracer accumulation identified within the proximal right colon. On subsequent imaging there is progressive scratch set on the subsequent images this focus increases in intensity and there is antegrade progression of the radiopharmaceutical conforming to the expected configuration of the colon. IMPRESSION: 1. Exam positive for active GI bleed. Source of bleeding appears to be the proximal right colon. Electronically Signed   By: Signa Kell M.D.   On: 10/17/2018 13:33      Allergies as of 10/18/2018   No Known Allergies     Medication List    STOP taking these medications    indomethacin 25 MG capsule Commonly known as:  INDOCIN     TAKE these medications   allopurinol 300 MG tablet Commonly known as:  ZYLOPRIM Take 300 mg by mouth as needed.   losartan 50 MG tablet Commonly known as:  COZAAR Take 50 mg by mouth daily.   pantoprazole 40 MG tablet Commonly known as:  Protonix Take 1 tablet (40 mg total) by mouth 2 (two) times daily.         Management plans discussed with the patient and he is in agreement. Stable for discharge home  Patient should follow up with vascular  CODE STATUS:     Code Status Orders  (From admission, onward)         Start     Ordered   10/17/18 1351  Full code  Continuous     10/17/18 1350        Code Status History    Date Active Date Inactive Code Status Order ID Comments User Context   03/15/2017 0222 03/16/2017 2103 Full Code [id number]  Oralia Manis, MD Inpatient      TOTAL TIME TAKING CARE  OF THIS PATIENT: 38 minutes.    Note: This dictation was prepared with Dragon dictation along with smaller phrase technology. Any transcriptional errors that result from this process are unintentional.  Adrian SaranSital Valeska Haislip M.D on 10/18/2018 at 10:18 AM  Between 7am to 6pm - Pager - [phone] After 6pm go to [email or web address] - Social research officer, governmentpassword EPAS [facility]  [facility]  Office  [phone]  CC: Primary care physician; Mick SellFitzgerald, David P, MD

## 2018-10-19 LAB — HIV ANTIBODY (ROUTINE TESTING W REFLEX): HIV Screen 4th Generation wRfx: NONREACTIVE

## 2018-10-25 ENCOUNTER — Inpatient Hospital Stay: Payer: Managed Care, Other (non HMO) | Admitting: Infectious Diseases

## 2018-10-26 ENCOUNTER — Ambulatory Visit (INDEPENDENT_AMBULATORY_CARE_PROVIDER_SITE_OTHER): Payer: Managed Care, Other (non HMO) | Admitting: Nurse Practitioner

## 2019-08-27 IMAGING — CT CT ABD-PELV W/ CM
2 of 5 series · 14 of 46 positions shown, 16 images · IV contrast (APPLIED)
Comparison: None.

CLINICAL DATA: Acute onset of rectal bleeding. Diaphoresis. Initial
encounter.

EXAM:
CT ABDOMEN AND PELVIS WITH CONTRAST
TECHNIQUE: Multidetector CT imaging of the abdomen and pelvis was performed
using the standard protocol following bolus administration of
intravenous contrast.
CONTRAST:  100mL GR4UE1-FXX IOPAMIDOL (GR4UE1-FXX) INJECTION 61%

[Series 2: routine abd/pel with · axial · 0.83mm/px · z∈[+245,+660]mm · 11 of 93 slices shown, 13 images]
[im 5/93  soft-tissue]
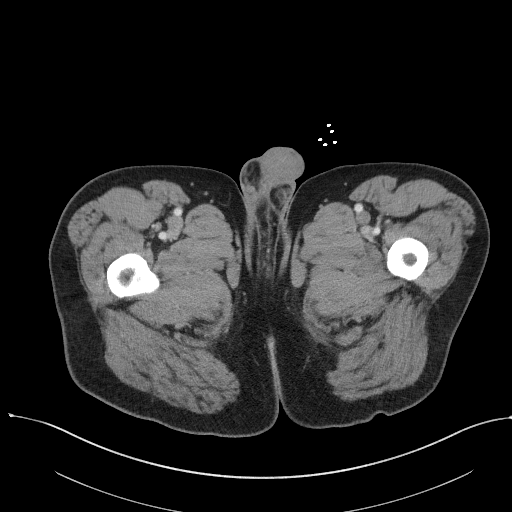
[im 5/93  bone]
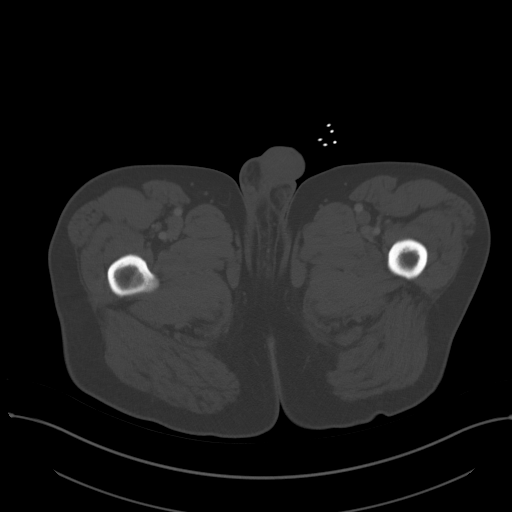
[im 14/93  soft-tissue]
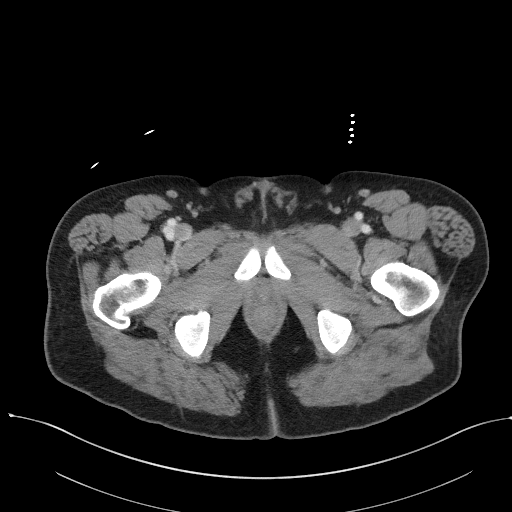
[im 24/93  soft-tissue]
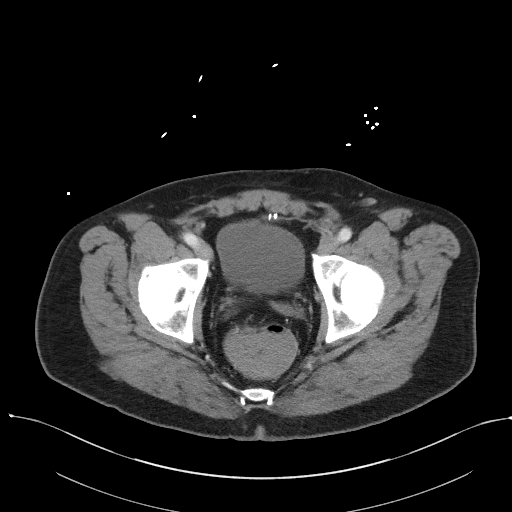
[im 33/93  soft-tissue]
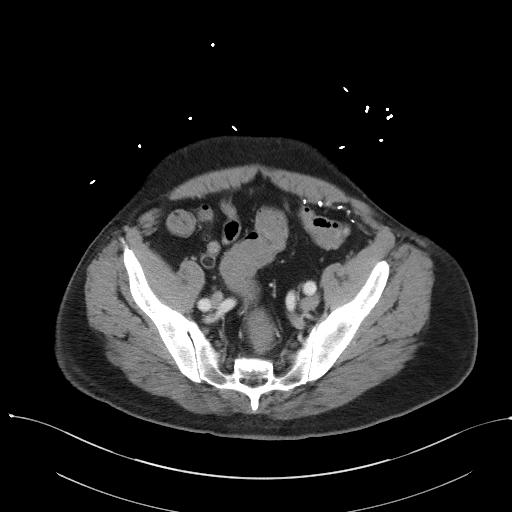
[im 37/93  soft-tissue]
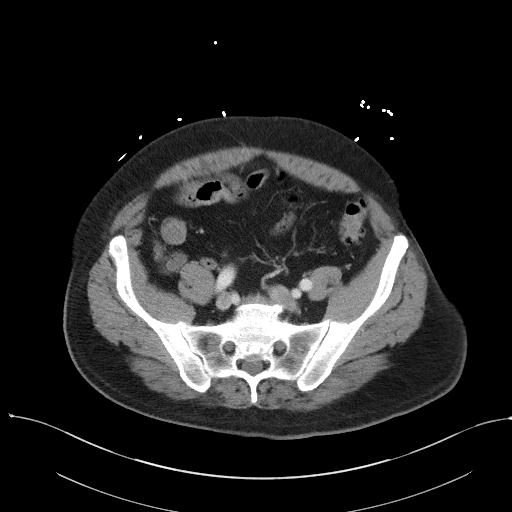
[im 47/93  soft-tissue]
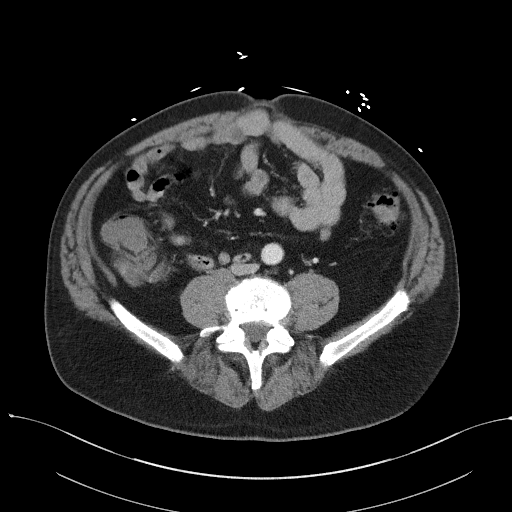
[im 56/93  soft-tissue]
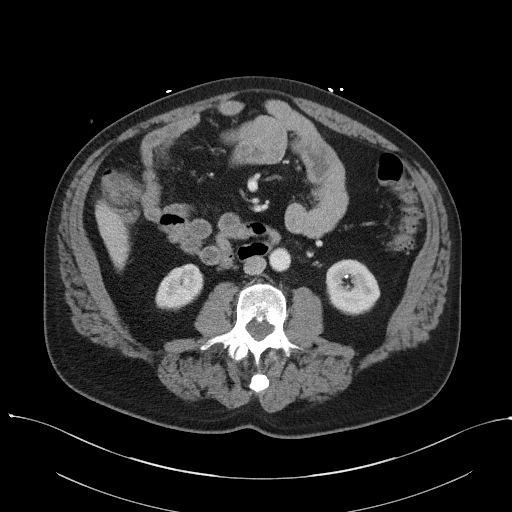
[im 60/93  soft-tissue]
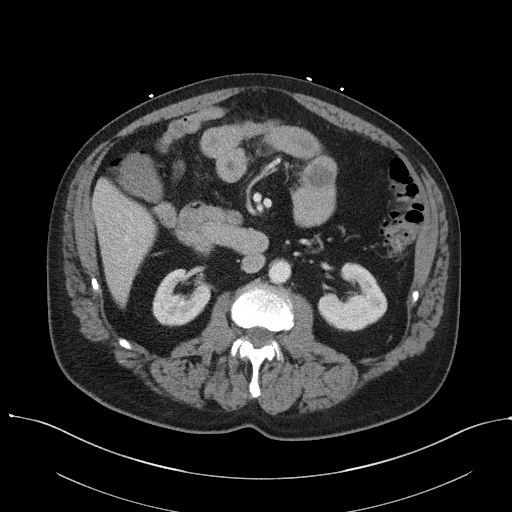
[im 70/93  soft-tissue]
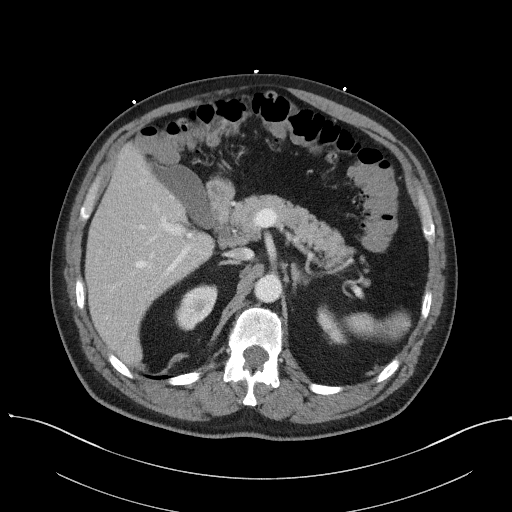
[im 70/93  bone]
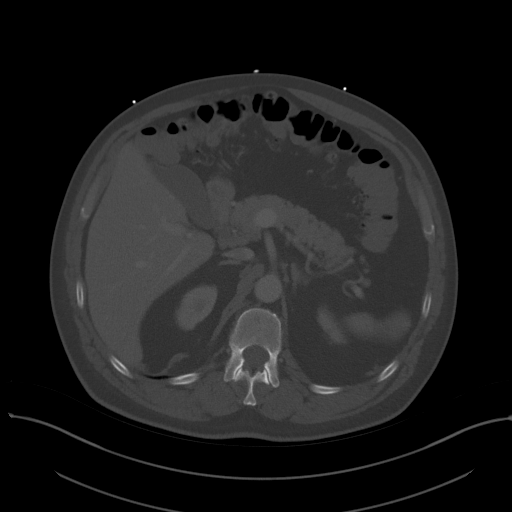
[im 79/93  soft-tissue]
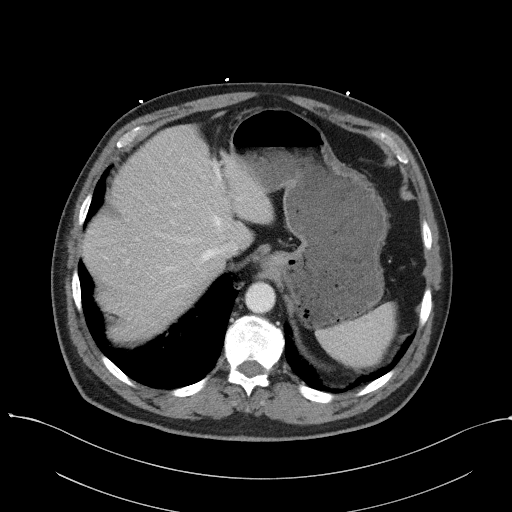
[im 88/93  soft-tissue]
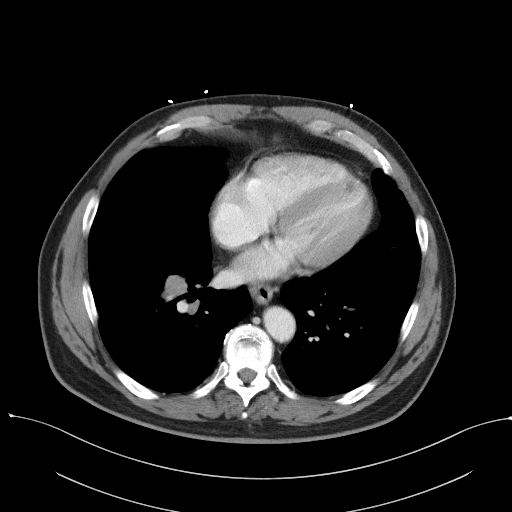

[Series 5: coronal st · coronal · 0.71mm/px · 3 of 100 slices shown]
[im 34/100  soft-tissue]
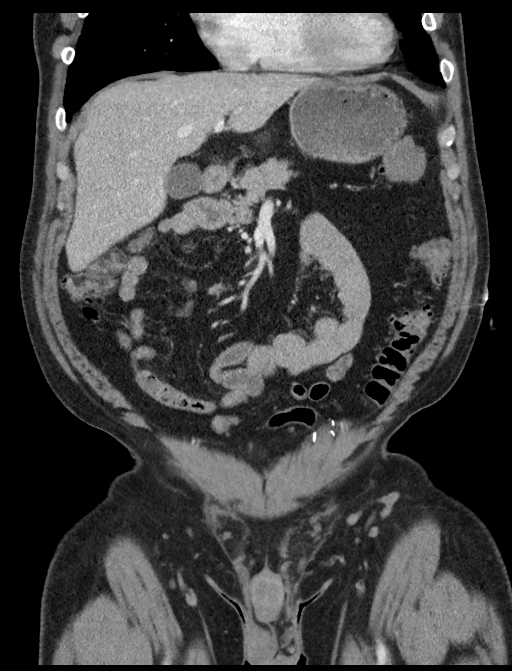
[im 45/100  soft-tissue]
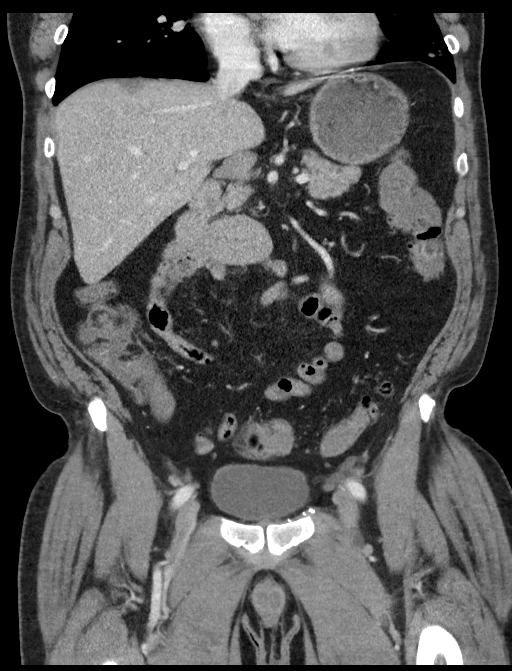
[im 56/100  soft-tissue]
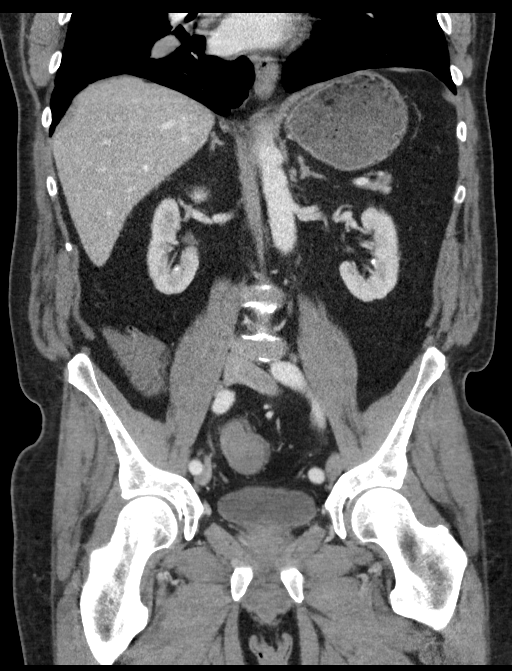

[14 of 46 positions shown; findings below may reference images not displayed]

FINDINGS: Lower chest: Enlarged mediastinal and bilateral peribronchial nodes
are seen, measuring up to 1.9 cm at the right peribronchial region,
and up to 1.3 cm at the azygoesophageal recess. Underlying calcified
right hilar nodes are suspected. The distribution would be somewhat
unusual for lymphoma.

Given that no additional mass is seen on this study, metastatic
disease from primary bronchogenic malignancy cannot be excluded.
Alternatively, systemic inflammatory conditions might have a similar
appearance. CT of the chest would be helpful for further evaluation,
when and as deemed clinically appropriate.

Minimal scarring is noted at the lung bases.

Hepatobiliary: The liver is unremarkable in appearance. The
gallbladder is unremarkable in appearance. The common bile duct
remains normal in caliber.

Pancreas: The pancreas is within normal limits.

Spleen: The spleen is unremarkable in appearance.

Adrenals/Urinary Tract: The adrenal glands are unremarkable in
appearance.

Scattered nonobstructing bilateral renal stones are seen, measuring
up to 4 mm in size. There is no evidence of hydronephrosis. No
obstructing ureteral stones are identified. No perinephric stranding
is appreciated.

Stomach/Bowel: The appendix is slightly distended, measuring up to
1.2 cm in diameter, but no associated soft tissue inflammation is
seen to suggest appendicitis.

Scattered diverticulosis is noted along the entirety of the colon,
without definite evidence of diverticulitis. The small bowel is
grossly unremarkable in appearance. The stomach is grossly
unremarkable.

Vascular/Lymphatic: The abdominal aorta is unremarkable in
appearance. The inferior vena cava is grossly unremarkable. No
retroperitoneal lymphadenopathy is seen. No pelvic sidewall
lymphadenopathy is identified.

Reproductive: The bladder is mildly distended and grossly
unremarkable. The prostate is normal in size.

Other: No additional soft tissue abnormalities are seen.

Musculoskeletal: No acute osseous abnormalities are identified.
Vacuum phenomenon is noted along the lower thoracic and lower lumbar
spine. The visualized musculature is unremarkable in appearance.
IMPRESSION: 1. Enlarged mediastinal and bilateral peribronchial nodes, measuring
up to 1.9 cm at the right peribronchial region. Underlying suspected
calcified right hilar nodes. The distribution would be somewhat
unusual for lymphoma. Given the lack of additional mass on this
study, metastatic disease from primary bronchogenic malignancy
cannot be excluded. Alternately, systemic inflammatory conditions
might have a similar appearance. CT of the chest would be helpful
for further evaluation, when and as deemed clinically appropriate.
2. Scattered diverticulosis along the entirety of the colon, without
definite evidence of diverticulitis. This may correspond to the
patient's rectal bleeding.
3. Scattered nonobstructing bilateral renal stones measure up to 4
mm in size.
4. Distention of the appendix is thought to reflect the patient's
baseline, given the lack of associated soft tissue inflammation. No
definite evidence for appendicitis.

## 2021-03-31 IMAGING — NM NUCLEAR MEDICINE GASTROINTESTINAL BLEEDING STUDY
1 series · 6 of 6 positions shown · non-contrast
Comparison: None

CLINICAL DATA: Remote history of GI bleed.  Now with bloody stools.

EXAM:
NUCLEAR MEDICINE GASTROINTESTINAL BLEEDING SCAN
TECHNIQUE: Sequential abdominal images were obtained following intravenous
administration of Cc-LLm labeled red blood cells.
RADIOPHARMACEUTICALS:  20.275 mCi Cc-LLm pertechnetate in-vitro
labeled red cells.

[Series 1000: hour 1 gi bleed · 4.80mm/px · 6 of 60 frames shown]
[frame 6/60]
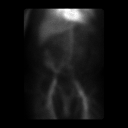
[frame 16/60]
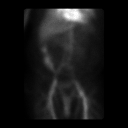
[frame 26/60]
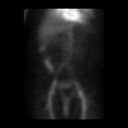
[frame 36/60]
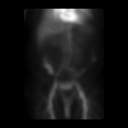
[frame 46/60]
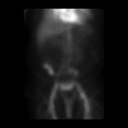
[frame 56/60]
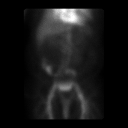

[6 of 6 positions shown; findings below may reference images not displayed]

FINDINGS: On the early images there is radiotracer accumulation identified
within the proximal right colon. On subsequent imaging there is
progressive scratch set on the subsequent images this focus
increases in intensity and there is antegrade progression of the
radiopharmaceutical conforming to the expected configuration of the
colon.
IMPRESSION: 1. Exam positive for active GI bleed. Source of bleeding appears to
be the proximal right colon.

## 2022-02-21 LAB — SURGICAL PATHOLOGY

## 2022-04-19 ENCOUNTER — Ambulatory Visit (LOCAL_COMMUNITY_HEALTH_CENTER): Payer: Managed Care, Other (non HMO)

## 2022-04-19 DIAGNOSIS — Z719 Counseling, unspecified: Secondary | ICD-10-CM

## 2022-04-19 DIAGNOSIS — Z23 Encounter for immunization: Secondary | ICD-10-CM | POA: Diagnosis not present

## 2022-04-19 NOTE — Progress Notes (Signed)
  Are you feeling sick today? No   Have you ever received a dose of COVID-19 Vaccine? [employer], [place], [place], [place], Other) Yes  If yes, which vaccine and how many doses?   Pfizer 4 doses   Did you bring the vaccination record card or other documentation?  Yes   Do you have a health condition or are undergoing treatment that makes you moderately or severely immunocompromised? This would include, but not be limited to: cancer, HIV, organ transplant, immunosuppressive therapy/high-dose corticosteroids, or moderate/severe primary immunodeficiency.  No  Have you received COVID-19 vaccine before or during hematopoietic cell transplant (HCT) or CAR-T-cell therapies? No  Have you ever had an allergic reaction to: (This would include a severe allergic reaction or a reaction that caused hives, swelling, or respiratory distress, including wheezing.) A component of a COVID-19 vaccine or a previous dose of COVID-19 vaccine? No   Have you ever had an allergic reaction to another vaccine (other thanCOVID-19 vaccine) or an injectable medication? (This would include a severe allergic reaction or a reaction that caused hives, swelling, or respiratory distress, including wheezing.)   No    Do you have a history of any of the following:  Myocarditis or Pericarditis No  Dermal fillers:  No  Multisystem Inflammatory Syndrome (MIS-C or MIS-A)? No  COVID-19 disease within the past 3 months? No  Vaccinated with monkeypox vaccine in the last 4 weeks? No   VIS provided.  Tolerated well. IM in left deltoid.  COVID card updated and [facility] updated and copy provided. Waited 15 minutes.

## 2022-06-27 DIAGNOSIS — M85661 Other cyst of bone, right lower leg: Secondary | ICD-10-CM | POA: Diagnosis not present

## 2022-06-27 DIAGNOSIS — M1711 Unilateral primary osteoarthritis, right knee: Secondary | ICD-10-CM | POA: Diagnosis not present

## 2022-06-27 DIAGNOSIS — D1723 Benign lipomatous neoplasm of skin and subcutaneous tissue of right leg: Secondary | ICD-10-CM | POA: Diagnosis not present

## 2022-06-27 DIAGNOSIS — M85662 Other cyst of bone, left lower leg: Secondary | ICD-10-CM | POA: Diagnosis not present

## 2022-06-30 DIAGNOSIS — N1831 Chronic kidney disease, stage 3a: Secondary | ICD-10-CM | POA: Diagnosis not present

## 2022-06-30 DIAGNOSIS — M170 Bilateral primary osteoarthritis of knee: Secondary | ICD-10-CM | POA: Diagnosis not present

## 2022-06-30 DIAGNOSIS — D869 Sarcoidosis, unspecified: Secondary | ICD-10-CM | POA: Diagnosis not present

## 2022-06-30 DIAGNOSIS — M1000 Idiopathic gout, unspecified site: Secondary | ICD-10-CM | POA: Diagnosis not present

## 2022-06-30 DIAGNOSIS — G8929 Other chronic pain: Secondary | ICD-10-CM | POA: Diagnosis not present

## 2022-06-30 DIAGNOSIS — M25561 Pain in right knee: Secondary | ICD-10-CM | POA: Diagnosis not present

## 2022-06-30 DIAGNOSIS — Z0000 Encounter for general adult medical examination without abnormal findings: Secondary | ICD-10-CM | POA: Diagnosis not present

## 2022-06-30 DIAGNOSIS — I10 Essential (primary) hypertension: Secondary | ICD-10-CM | POA: Diagnosis not present

## 2022-06-30 DIAGNOSIS — R7303 Prediabetes: Secondary | ICD-10-CM | POA: Diagnosis not present

## 2022-08-04 DIAGNOSIS — D480 Neoplasm of uncertain behavior of bone and articular cartilage: Secondary | ICD-10-CM | POA: Diagnosis not present

## 2022-09-19 DIAGNOSIS — S29011A Strain of muscle and tendon of front wall of thorax, initial encounter: Secondary | ICD-10-CM | POA: Diagnosis not present

## 2022-10-14 DIAGNOSIS — X58XXXA Exposure to other specified factors, initial encounter: Secondary | ICD-10-CM | POA: Diagnosis not present

## 2022-10-14 DIAGNOSIS — M84451A Pathological fracture, right femur, initial encounter for fracture: Secondary | ICD-10-CM | POA: Diagnosis not present

## 2022-10-14 DIAGNOSIS — S83231A Complex tear of medial meniscus, current injury, right knee, initial encounter: Secondary | ICD-10-CM | POA: Diagnosis not present

## 2022-10-14 DIAGNOSIS — D480 Neoplasm of uncertain behavior of bone and articular cartilage: Secondary | ICD-10-CM | POA: Diagnosis not present

## 2022-10-14 DIAGNOSIS — M898X0 Other specified disorders of bone, multiple sites: Secondary | ICD-10-CM | POA: Diagnosis not present

## 2022-10-14 DIAGNOSIS — M7041 Prepatellar bursitis, right knee: Secondary | ICD-10-CM | POA: Diagnosis not present

## 2022-10-27 DIAGNOSIS — M25819 Other specified joint disorders, unspecified shoulder: Secondary | ICD-10-CM | POA: Diagnosis not present

## 2022-10-27 DIAGNOSIS — M25511 Pain in right shoulder: Secondary | ICD-10-CM | POA: Diagnosis not present

## 2022-11-10 DIAGNOSIS — M7989 Other specified soft tissue disorders: Secondary | ICD-10-CM | POA: Diagnosis not present

## 2022-11-10 DIAGNOSIS — Z87891 Personal history of nicotine dependence: Secondary | ICD-10-CM | POA: Diagnosis not present

## 2022-11-10 DIAGNOSIS — Z79899 Other long term (current) drug therapy: Secondary | ICD-10-CM | POA: Diagnosis not present

## 2022-11-10 DIAGNOSIS — M109 Gout, unspecified: Secondary | ICD-10-CM | POA: Diagnosis not present

## 2022-11-17 DIAGNOSIS — M7581 Other shoulder lesions, right shoulder: Secondary | ICD-10-CM | POA: Diagnosis not present

## 2022-11-24 DIAGNOSIS — K922 Gastrointestinal hemorrhage, unspecified: Secondary | ICD-10-CM | POA: Diagnosis not present

## 2022-11-24 DIAGNOSIS — N200 Calculus of kidney: Secondary | ICD-10-CM | POA: Diagnosis not present

## 2022-11-24 DIAGNOSIS — D62 Acute posthemorrhagic anemia: Secondary | ICD-10-CM | POA: Diagnosis not present

## 2022-11-24 DIAGNOSIS — Z79899 Other long term (current) drug therapy: Secondary | ICD-10-CM | POA: Diagnosis not present

## 2022-11-24 DIAGNOSIS — K5731 Diverticulosis of large intestine without perforation or abscess with bleeding: Secondary | ICD-10-CM | POA: Diagnosis not present

## 2022-11-24 DIAGNOSIS — N179 Acute kidney failure, unspecified: Secondary | ICD-10-CM | POA: Diagnosis not present

## 2022-11-24 DIAGNOSIS — K8689 Other specified diseases of pancreas: Secondary | ICD-10-CM | POA: Diagnosis not present

## 2022-11-24 DIAGNOSIS — I10 Essential (primary) hypertension: Secondary | ICD-10-CM | POA: Diagnosis not present

## 2022-11-24 DIAGNOSIS — M109 Gout, unspecified: Secondary | ICD-10-CM | POA: Diagnosis not present

## 2022-12-05 DIAGNOSIS — M7581 Other shoulder lesions, right shoulder: Secondary | ICD-10-CM | POA: Diagnosis not present

## 2022-12-05 DIAGNOSIS — Z09 Encounter for follow-up examination after completed treatment for conditions other than malignant neoplasm: Secondary | ICD-10-CM | POA: Diagnosis not present

## 2022-12-05 DIAGNOSIS — Z8719 Personal history of other diseases of the digestive system: Secondary | ICD-10-CM | POA: Diagnosis not present

## 2022-12-12 DIAGNOSIS — M7581 Other shoulder lesions, right shoulder: Secondary | ICD-10-CM | POA: Diagnosis not present

## 2022-12-20 DIAGNOSIS — M7581 Other shoulder lesions, right shoulder: Secondary | ICD-10-CM | POA: Diagnosis not present

## 2022-12-26 ENCOUNTER — Ambulatory Visit: Payer: PPO | Admitting: Orthopaedic Surgery

## 2022-12-26 DIAGNOSIS — G8929 Other chronic pain: Secondary | ICD-10-CM

## 2022-12-26 DIAGNOSIS — M25561 Pain in right knee: Secondary | ICD-10-CM

## 2022-12-26 DIAGNOSIS — M1711 Unilateral primary osteoarthritis, right knee: Secondary | ICD-10-CM | POA: Insufficient documentation

## 2022-12-26 NOTE — Progress Notes (Signed)
The patient is a 67 year old gentleman that I am seeing for the first time.  He is referred from church members due to severe arthritis involving his right knee.  He has a significantly interesting situation and the fact that he has chronic gout.  He has a MRI and plain films of the right knee from interview today.  He was actually referred to [facility] due to a large cystic mass in the tibial metaphysis on that right knee.  Surprisingly this ended up being gout.  He does have a long history of gout and has tophi of gout around bony prominences.  He is a thin individual.  He does not have any significant medical issues otherwise and is not on blood thinning medications.  I was able to review all of his notes within epic as well as his medications.  We had a long and thorough discussion about his knee situation.  He would like to have a knee replacement closer to home which will make it easier for family.  His right knee pain is daily and it is 10 out of 10.  It is waking him up.  At this point it is detrimentally affecting his mobility, his quality of life and his activities day living to the point he does wish to proceed with a knee replacement.  Examination of his right knee shows varus malalignment.  There is chronic prepatellar bursal changes which is likely the gouty tophi.  He has full range of motion of the knee with significant medial joint line tenderness.  I did review the MRI images and the plain film images.  There is a large cystic lesion in the metaphysis of the tibia and the lower joint fusion.  There is significant tricompartment arthritis with that knee with areas of full-thickness cartilage loss throughout.  We had a long and thorough discussion about knee replacement surgery.  His certainly presents a more difficult situation in terms of the need for a revision tibial component with a longstem given the nature of his metaphyseal bone.  He understands we will have to be gentle with this and this  will be a more difficult surgery given his cystic changes in the bone.  We had a long and thorough discussion about the risks and benefits of the surgery and what to expect from an intraoperative and postoperative course.  I showed him a knee model and a knee replacement model and again we talked in detail about his knee.  All questions and concerns were answered and addressed.  We will work on getting him scheduled for a right knee replacement.

## 2023-01-03 DIAGNOSIS — M7581 Other shoulder lesions, right shoulder: Secondary | ICD-10-CM | POA: Diagnosis not present

## 2023-01-09 ENCOUNTER — Other Ambulatory Visit: Payer: Self-pay

## 2023-01-11 DIAGNOSIS — Z125 Encounter for screening for malignant neoplasm of prostate: Secondary | ICD-10-CM | POA: Diagnosis not present

## 2023-01-11 DIAGNOSIS — M1000 Idiopathic gout, unspecified site: Secondary | ICD-10-CM | POA: Diagnosis not present

## 2023-01-11 DIAGNOSIS — R7303 Prediabetes: Secondary | ICD-10-CM | POA: Diagnosis not present

## 2023-01-11 DIAGNOSIS — N1831 Chronic kidney disease, stage 3a: Secondary | ICD-10-CM | POA: Diagnosis not present

## 2023-01-11 DIAGNOSIS — M170 Bilateral primary osteoarthritis of knee: Secondary | ICD-10-CM | POA: Diagnosis not present

## 2023-01-11 DIAGNOSIS — Z0000 Encounter for general adult medical examination without abnormal findings: Secondary | ICD-10-CM | POA: Diagnosis not present

## 2023-01-11 DIAGNOSIS — D869 Sarcoidosis, unspecified: Secondary | ICD-10-CM | POA: Diagnosis not present

## 2023-01-11 DIAGNOSIS — I10 Essential (primary) hypertension: Secondary | ICD-10-CM | POA: Diagnosis not present

## 2023-02-13 DIAGNOSIS — I10 Essential (primary) hypertension: Secondary | ICD-10-CM | POA: Diagnosis not present

## 2023-02-13 DIAGNOSIS — M1000 Idiopathic gout, unspecified site: Secondary | ICD-10-CM | POA: Diagnosis not present

## 2023-02-13 DIAGNOSIS — R7303 Prediabetes: Secondary | ICD-10-CM | POA: Diagnosis not present

## 2023-02-13 DIAGNOSIS — Z125 Encounter for screening for malignant neoplasm of prostate: Secondary | ICD-10-CM | POA: Diagnosis not present

## 2023-02-14 NOTE — Progress Notes (Signed)
Surgical Instructions   Your procedure is scheduled on February 28, 2023. Report to [facility] Main Entrance "A" at 07:52 A.M., then check in with the Admitting office. Any questions or running late day of surgery: call [phone]  Questions prior to your surgery date: call [phone], Monday-Friday, 8am-4pm. If you experience any cold or flu symptoms such as cough, fever, chills, shortness of breath, etc. between now and your scheduled surgery, please notify us at the above number.     Remember:  Do not eat after midnight the night before your surgery  You may drink clear liquids until midnight the morning of your surgery.   Clear liquids allowed are: Water, Non-Citrus Juices (without pulp), Carbonated Beverages, Clear Tea, Black Coffee Only (NO MILK, CREAM OR POWDERED CREAMER of any kind), and Gatorade.    Take these medicines the morning of surgery with A SIP OF WATER   allopurinol (ZYLOPRIM)  atorvastatin (LIPITOR)  pantoprazole (PROTONIX)    May take these medicines IF NEEDED:  indomethacin (INDOCIN)    One week prior to surgery, STOP taking any Aspirin (unless otherwise instructed by your surgeon) Aleve, Naproxen, Ibuprofen, Motrin, Advil, Goody's, BC's, all herbal medications, fish oil, and non-prescription vitamins.                     Do NOT Smoke (Tobacco/Vaping) for 24 hours prior to your procedure.  If you use a CPAP at night, you may bring your mask/headgear for your overnight stay.   You will be asked to remove any contacts, glasses, piercing's, hearing aid's, dentures/partials prior to surgery. Please bring cases for these items if needed.    Patients discharged the day of surgery will not be allowed to drive home, and someone needs to stay with them for 24 hours.  SURGICAL WAITING ROOM VISITATION Patients may have no more than 2 support people in the waiting area - these visitors may rotate.   Pre-op nurse will coordinate an appropriate time for 1 ADULT  support person, who may not rotate, to accompany patient in pre-op.  Children under the age of 54 must have an adult with them who is not the patient and must remain in the main waiting area with an adult.  If the patient needs to stay at the hospital during part of their recovery, the visitor guidelines for inpatient rooms apply.  Please refer to the [facility] website for the visitor guidelines for any additional information.   If you received a COVID test during your pre-op visit  it is requested that you wear a mask when out in public, stay away from anyone that may not be feeling well and notify your surgeon if you develop symptoms. If you have been in contact with anyone that has tested positive in the last 10 days please notify you surgeon.      Pre-operative 5 CHG Bathing Instructions   You can play a key role in reducing the risk of infection after surgery. Your skin needs to be as free of germs as possible. You can reduce the number of germs on your skin by washing with CHG (chlorhexidine gluconate) soap before surgery. CHG is an antiseptic soap that kills germs and continues to kill germs even after washing.   DO NOT use if you have an allergy to chlorhexidine/CHG or antibacterial soaps. If your skin becomes reddened or irritated, stop using the CHG and notify one of our RNs at [phone].   Please shower with the CHG soap  starting 4 days before surgery using the following schedule:     Please keep in mind the following:  DO NOT shave, including legs and underarms, starting the day of your first shower.   You may shave your face at any point before/day of surgery.  Place clean sheets on your bed the day you start using CHG soap. Use a clean washcloth (not used since being washed) for each shower. DO NOT sleep with pets once you start using the CHG.   CHG Shower Instructions:  If you choose to wash your hair and private area, wash first with your normal shampoo/soap.   After you use shampoo/soap, rinse your hair and body thoroughly to remove shampoo/soap residue.  Turn the water OFF and apply about 3 tablespoons (45 ml) of CHG soap to a CLEAN washcloth.  Apply CHG soap ONLY FROM YOUR NECK DOWN TO YOUR TOES (washing for 3-5 minutes)  DO NOT use CHG soap on face, private areas, open wounds, or sores.  Pay special attention to the area where your surgery is being performed.  If you are having back surgery, having someone wash your back for you may be helpful. Wait 2 minutes after CHG soap is applied, then you may rinse off the CHG soap.  Pat dry with a clean towel  Put on clean clothes/pajamas   If you choose to wear lotion, please use ONLY the CHG-compatible lotions on the back of this paper.   Additional instructions for the day of surgery: DO NOT APPLY any lotions, deodorants, cologne, or perfumes.   Do not bring valuables to the hospital. [facility] is not responsible for any belongings/valuables. Do not wear nail polish, gel polish, artificial nails, or any other type of covering on natural nails (fingers and toes) Do not wear jewelry or makeup Put on clean/comfortable clothes.  Please brush your teeth.  Ask your nurse before applying any prescription medications to the skin.     CHG Compatible Lotions   Aveeno Moisturizing lotion  Cetaphil Moisturizing Cream  Cetaphil Moisturizing Lotion  Clairol Herbal Essence Moisturizing Lotion, Dry Skin  Clairol Herbal Essence Moisturizing Lotion, Extra Dry Skin  Clairol Herbal Essence Moisturizing Lotion, Normal Skin  Curel Age Defying Therapeutic Moisturizing Lotion with Alpha Hydroxy  Curel Extreme Care Body Lotion  Curel Soothing Hands Moisturizing Hand Lotion  Curel Therapeutic Moisturizing Cream, Fragrance-Free  Curel Therapeutic Moisturizing Lotion, Fragrance-Free  Curel Therapeutic Moisturizing Lotion, Original Formula  Eucerin Daily Replenishing Lotion  Eucerin Dry Skin Therapy Plus Alpha  Hydroxy Crme  Eucerin Dry Skin Therapy Plus Alpha Hydroxy Lotion  Eucerin Original Crme  Eucerin Original Lotion  Eucerin Plus Crme Eucerin Plus Lotion  Eucerin TriLipid Replenishing Lotion  Keri Anti-Bacterial Hand Lotion  Keri Deep Conditioning Original Lotion Dry Skin Formula Softly Scented  Keri Deep Conditioning Original Lotion, Fragrance Free Sensitive Skin Formula  Keri Lotion Fast Absorbing Fragrance Free Sensitive Skin Formula  Keri Lotion Fast Absorbing Softly Scented Dry Skin Formula  Keri Original Lotion  Keri Skin Renewal Lotion Keri Silky Smooth Lotion  Keri Silky Smooth Sensitive Skin Lotion  Nivea Body Creamy Conditioning Oil  Nivea Body Extra Enriched Lotion  Nivea Body Original Lotion  Nivea Body Sheer Moisturizing Lotion Nivea Crme  Nivea Skin Firming Lotion  NutraDerm 30 Skin Lotion  NutraDerm Skin Lotion  NutraDerm Therapeutic Skin Cream  NutraDerm Therapeutic Skin Lotion  ProShield Protective Hand Cream  Provon moisturizing lotion  Please read over the following fact sheets that you were given.

## 2023-02-14 NOTE — Progress Notes (Signed)
Surgical Instructions   Your procedure is scheduled on February 28, 2023. Report to [facility] Main Entrance "A" at 07:50 A.M., then check in with the Admitting office. Any questions or running late day of surgery: call [phone]  Questions prior to your surgery date: call [phone], Monday-Friday, 8am-4pm. If you experience any cold or flu symptoms such as cough, fever, chills, shortness of breath, etc. between now and your scheduled surgery, please notify us at the above number.     Remember:  Do not eat after midnight the night before your surgery  You may drink clear liquids until 06:50 AM morning of your surgery.   Clear liquids allowed are: Water, Non-Citrus Juices (without pulp), Carbonated Beverages, Clear Tea, Black Coffee Only (NO MILK, CREAM OR POWDERED CREAMER of any kind), and Gatorade.  Patient Instructions  The night before surgery:  No food after midnight. ONLY clear liquids after midnight  The day of surgery (if you do NOT have diabetes):  Drink ONE (1) Pre-Surgery Clear Ensure by 06:50 AM the morning of surgery. Drink in one sitting. Do not sip.  This drink was given to you during your hospital  pre-op appointment visit.  Nothing else to drink after completing the  Pre-Surgery Clear Ensure.          If you have questions, please contact your surgeon's office.    Take these medicines the morning of surgery with A SIP OF WATER   allopurinol (ZYLOPRIM)  atorvastatin (LIPITOR)  pantoprazole (PROTONIX)    May take these medicines IF NEEDED: indomethacin (INDOCIN)    One week prior to surgery, STOP taking any Aspirin (unless otherwise instructed by your surgeon) Aleve, Naproxen, Ibuprofen, Motrin, Advil, Goody's, BC's, indomethacin (INDOCIN), all herbal medications, fish oil, and non-prescription vitamins.                     Do NOT Smoke (Tobacco/Vaping) for 24 hours prior to your procedure.  If you use a CPAP at night, you may bring your  mask/headgear for your overnight stay.   You will be asked to remove any contacts, glasses, piercing's, hearing aid's, dentures/partials prior to surgery. Please bring cases for these items if needed.    Patients discharged the day of surgery will not be allowed to drive home, and someone needs to stay with them for 24 hours.  SURGICAL WAITING ROOM VISITATION Patients may have no more than 2 support people in the waiting area - these visitors may rotate.   Pre-op nurse will coordinate an appropriate time for 1 ADULT support person, who may not rotate, to accompany patient in pre-op.  Children under the age of 12 must have an adult with them who is not the patient and must remain in the main waiting area with an adult.  If the patient needs to stay at the hospital during part of their recovery, the visitor guidelines for inpatient rooms apply.  Please refer to the [facility] website for the visitor guidelines for any additional information.   If you received a COVID test during your pre-op visit  it is requested that you wear a mask when out in public, stay away from anyone that may not be feeling well and notify your surgeon if you develop symptoms. If you have been in contact with anyone that has tested positive in the last 10 days please notify you surgeon.      Pre-operative 5 CHG Bathing Instructions   You can play a key role in  reducing the risk of infection after surgery. Your skin needs to be as free of germs as possible. You can reduce the number of germs on your skin by washing with CHG (chlorhexidine gluconate) soap before surgery. CHG is an antiseptic soap that kills germs and continues to kill germs even after washing.   DO NOT use if you have an allergy to chlorhexidine/CHG or antibacterial soaps. If your skin becomes reddened or irritated, stop using the CHG and notify one of our RNs at [phone].   Please shower with the CHG soap starting 4 days before surgery using the  following schedule:     Please keep in mind the following:  DO NOT shave, including legs and underarms, starting the day of your first shower.   You may shave your face at any point before/day of surgery.  Place clean sheets on your bed the day you start using CHG soap. Use a clean washcloth (not used since being washed) for each shower. DO NOT sleep with pets once you start using the CHG.   CHG Shower Instructions:  If you choose to wash your hair and private area, wash first with your normal shampoo/soap.  After you use shampoo/soap, rinse your hair and body thoroughly to remove shampoo/soap residue.  Turn the water OFF and apply about 3 tablespoons (45 ml) of CHG soap to a CLEAN washcloth.  Apply CHG soap ONLY FROM YOUR NECK DOWN TO YOUR TOES (washing for 3-5 minutes)  DO NOT use CHG soap on face, private areas, open wounds, or sores.  Pay special attention to the area where your surgery is being performed.  If you are having back surgery, having someone wash your back for you may be helpful. Wait 2 minutes after CHG soap is applied, then you may rinse off the CHG soap.  Pat dry with a clean towel  Put on clean clothes/pajamas   If you choose to wear lotion, please use ONLY the CHG-compatible lotions on the back of this paper.   Additional instructions for the day of surgery: DO NOT APPLY any lotions, deodorants, cologne, or perfumes.   Do not bring valuables to the hospital. [facility] is not responsible for any belongings/valuables. Do not wear nail polish, gel polish, artificial nails, or any other type of covering on natural nails (fingers and toes) Do not wear jewelry or makeup Put on clean/comfortable clothes.  Please brush your teeth.  Ask your nurse before applying any prescription medications to the skin.     CHG Compatible Lotions   Aveeno Moisturizing lotion  Cetaphil Moisturizing Cream  Cetaphil Moisturizing Lotion  Clairol Herbal Essence Moisturizing Lotion,  Dry Skin  Clairol Herbal Essence Moisturizing Lotion, Extra Dry Skin  Clairol Herbal Essence Moisturizing Lotion, Normal Skin  Curel Age Defying Therapeutic Moisturizing Lotion with Alpha Hydroxy  Curel Extreme Care Body Lotion  Curel Soothing Hands Moisturizing Hand Lotion  Curel Therapeutic Moisturizing Cream, Fragrance-Free  Curel Therapeutic Moisturizing Lotion, Fragrance-Free  Curel Therapeutic Moisturizing Lotion, Original Formula  Eucerin Daily Replenishing Lotion  Eucerin Dry Skin Therapy Plus Alpha Hydroxy Crme  Eucerin Dry Skin Therapy Plus Alpha Hydroxy Lotion  Eucerin Original Crme  Eucerin Original Lotion  Eucerin Plus Crme Eucerin Plus Lotion  Eucerin TriLipid Replenishing Lotion  Keri Anti-Bacterial Hand Lotion  Keri Deep Conditioning Original Lotion Dry Skin Formula Softly Scented  Keri Deep Conditioning Original Lotion, Fragrance Free Sensitive Skin Formula  Keri Lotion Fast Absorbing Fragrance Free Sensitive Skin Formula  Keri Lotion Fast Absorbing  Softly Scented Dry Skin Formula  Keri Original Lotion  Keri Skin Renewal Lotion Keri Silky Smooth Lotion  Keri Silky Smooth Sensitive Skin Lotion  Nivea Body Creamy Conditioning Oil  Nivea Body Extra Enriched Lotion  Nivea Body Original Lotion  Nivea Body Sheer Moisturizing Lotion Nivea Crme  Nivea Skin Firming Lotion  NutraDerm 30 Skin Lotion  NutraDerm Skin Lotion  NutraDerm Therapeutic Skin Cream  NutraDerm Therapeutic Skin Lotion  ProShield Protective Hand Cream  Provon moisturizing lotion  Please read over the following fact sheets that you were given.

## 2023-02-15 ENCOUNTER — Encounter (HOSPITAL_COMMUNITY): Payer: Self-pay

## 2023-02-15 ENCOUNTER — Other Ambulatory Visit: Payer: Self-pay

## 2023-02-15 ENCOUNTER — Encounter (HOSPITAL_COMMUNITY)
Admission: RE | Admit: 2023-02-15 | Discharge: 2023-02-15 | Disposition: A | Discharge status: Home / Self Care | Payer: PPO | Source: Ambulatory Visit | Attending: Orthopaedic Surgery | Admitting: Orthopaedic Surgery

## 2023-02-15 VITALS — BP 137/90 | HR 66 | Temp 98.0°F | Resp 18 | Ht 66.0 in | Wt 180.0 lb

## 2023-02-15 DIAGNOSIS — I10 Essential (primary) hypertension: Secondary | ICD-10-CM | POA: Insufficient documentation

## 2023-02-15 DIAGNOSIS — Z01818 Encounter for other preprocedural examination: Secondary | ICD-10-CM | POA: Insufficient documentation

## 2023-02-15 HISTORY — DX: Unilateral primary osteoarthritis, right knee: M17.11

## 2023-02-15 HISTORY — DX: Anxiety disorder, unspecified: F41.9

## 2023-02-15 LAB — SURGICAL PCR SCREEN
MRSA, PCR: NEGATIVE
Staphylococcus aureus: NEGATIVE

## 2023-02-15 NOTE — Progress Notes (Signed)
PCP - Dr. Clydie Braun Cardiologist - denies  PPM/ICD - denies   Chest x-ray - 10/18/18 EKG - 02/15/23 Stress Test - denies ECHO - denies Cardiac Cath - denies  Sleep Study - denies   DM- denies  ASA/Blood Thinner Instructions: n/a   ERAS Protcol - yes PRE-SURGERY Ensure given at PAT  COVID TEST- n/a   Anesthesia review: no  Patient denies shortness of breath, fever, cough and chest pain at PAT appointment   All instructions explained to the patient, with a verbal understanding of the material. Patient agrees to go over the instructions while at home for a better understanding. The opportunity to ask questions was provided.

## 2023-02-27 ENCOUNTER — Other Ambulatory Visit: Payer: Self-pay | Admitting: *Deleted

## 2023-02-27 ENCOUNTER — Telehealth: Payer: Self-pay | Admitting: *Deleted

## 2023-02-27 DIAGNOSIS — M1711 Unilateral primary osteoarthritis, right knee: Secondary | ICD-10-CM

## 2023-02-27 NOTE — Care Plan (Signed)
[facility] RNCM call to patient to discuss his upcoming Right total knee arthroplasty with Dr. Magnus Ivan on 02/28/23. He is an ortho bundle patient and is agreeable to case management. He lives with his spouse, who will be assisting him at home after discharge. He will need a RW prior to discharge from the hospital. Anticipate HHPT will be needed after a short hospital stay. Referral made to [facility] after choice provided. Reviewed all post op care instructions. Will continue to follow for needs.

## 2023-02-27 NOTE — Anesthesia Preprocedure Evaluation (Signed)
Anesthesia Evaluation  Patient identified by MRN, date of birth, ID band Patient awake    Reviewed: Allergy & Precautions, H&P , NPO status , Patient's Chart, lab work & pertinent test results  History of Anesthesia Complications Negative for: history of anesthetic complications  Airway Mallampati: I  TM Distance: >3 FB Neck ROM: Full    Dental  (+) Dental Advisory Given   Pulmonary former smoker   breath sounds clear to auscultation       Cardiovascular Exercise Tolerance: Good hypertension, Pt. on medications (-) angina  Rhythm:Regular Rate:Normal     Neuro/Psych   Anxiety     negative neurological ROS  negative psych ROS   GI/Hepatic negative GI ROS, Neg liver ROS,GERD  Medicated and Controlled,,  Endo/Other  negative endocrine ROS    Renal/GU negative Renal ROS  negative genitourinary   Musculoskeletal  (+) Arthritis , Osteoarthritis,    Abdominal   Peds  Hematology negative hematology ROS (+)   Anesthesia Other Findings   Reproductive/Obstetrics negative OB ROS                             Anesthesia Physical Anesthesia Plan  ASA: 2  Anesthesia Plan: Spinal   Post-op Pain Management: Regional block* and Tylenol PO (pre-op)*   Induction: Intravenous  PONV Risk Score and Plan: 2 and Ondansetron, Dexamethasone and Propofol infusion  Airway Management Planned: Natural Airway and Simple Face Mask  Additional Equipment: None  Intra-op Plan:   Post-operative Plan:   Informed Consent: I have reviewed the patients History and Physical, chart, labs and discussed the procedure including the risks, benefits and alternatives for the proposed anesthesia with the patient or authorized representative who has indicated his/her understanding and acceptance.     Dental advisory given  Plan Discussed with: CRNA and Surgeon  Anesthesia Plan Comments: (Plan routine monitors, SAB with  adductor canal block for post op analgesia)       Anesthesia Quick Evaluation

## 2023-02-27 NOTE — H&P (Signed)
TOTAL KNEE ADMISSION H&P  Patient is being admitted for right total knee arthroplasty.  Subjective:  Chief Complaint:right knee pain.  HPI: Darrell Freeman, 67 y.o. male, has a history of pain and functional disability in the right knee due to arthritis and gout  and has failed non-surgical conservative treatments for greater than 12 weeks to includeNSAID's and/or analgesics, corticosteriod injections, use of assistive devices, and activity modification.  Onset of symptoms was gradual, starting 3 years ago with gradually worsening course since that time. The patient noted no past surgery on the right knee(s).  Patient currently rates pain in the right knee(s) at 10 out of 10 with activity. Patient has night pain, worsening of pain with activity and weight bearing, pain that interferes with activities of daily living, pain with passive range of motion, crepitus, and joint swelling.  Patient has evidence of subchondral cysts, periarticular osteophytes, and joint space narrowing by imaging studies. There is no active infection.  Patient Active Problem List   Diagnosis Date Noted   Unilateral primary osteoarthritis, right knee 12/26/2022   GIB (gastrointestinal bleeding) 10/17/2018   GI bleed 03/15/2017   HTN (hypertension) 03/15/2017   GERD (gastroesophageal reflux disease) 03/15/2017   Past Medical History:  Diagnosis Date   Anxiety    Arthritis    Diverticulosis    GERD (gastroesophageal reflux disease)    GI bleed    Hypertension    Osteoarthritis of right knee     Past Surgical History:  Procedure Laterality Date   COLONOSCOPY WITH PROPOFOL N/A 03/16/2017   Procedure: COLONOSCOPY WITH PROPOFOL;  Surgeon: Wyline Mood, MD;  Location: [facility] ENDOSCOPY;  Service: Gastroenterology;  Laterality: N/A;   ESOPHAGOGASTRODUODENOSCOPY (EGD) WITH PROPOFOL N/A 03/16/2017   Procedure: ESOPHAGOGASTRODUODENOSCOPY (EGD) WITH PROPOFOL;  Surgeon: Wyline Mood, MD;  Location: [facility] ENDOSCOPY;  Service:  Gastroenterology;  Laterality: N/A;   OLECRANON BURSA EXCISION     OLECRANON BURSECTOMY Left 12/26/2014   Procedure: OLECRANON BURSA;  Surgeon: Myra Rude, MD;  Location: [facility] ORS;  Service: Orthopedics;  Laterality: Left;   VISCERAL ANGIOGRAPHY N/A 10/17/2018   Procedure: VISCERAL ANGIOGRAPHY;  Surgeon: Annice Needy, MD;  Location: [facility] INVASIVE CV LAB;  Service: Cardiovascular;  Laterality: N/A;    No current facility-administered medications for this encounter.   Current Outpatient Medications  Medication Sig Dispense Refill Last Dose   allopurinol (ZYLOPRIM) 100 MG tablet Take 100 mg by mouth daily.      atorvastatin (LIPITOR) 10 MG tablet Take 10 mg by mouth daily.      indomethacin (INDOCIN) 25 MG capsule Take 25 mg by mouth 2 (two) times daily as needed for moderate pain (gout flares).      losartan-hydrochlorothiazide (HYZAAR) 100-25 MG tablet Take 1 tablet by mouth daily.      pantoprazole (PROTONIX) 40 MG tablet Take 40 mg by mouth daily.      tadalafil (CIALIS) 20 MG tablet Take 20 mg by mouth daily as needed for erectile dysfunction.      No Known Allergies  Social History   Tobacco Use   Smoking status: Former    Current packs/day: 0.00    Types: Cigarettes    Start date: 12/11/1977    Quit date: 12/11/1984    Years since quitting: 38.2   Smokeless tobacco: Never  Substance Use Topics   Alcohol use: Not Currently    No family history on file.   Review of Systems  Objective:  Physical Exam Vitals reviewed.  Constitutional:  Appearance: Normal appearance. He is normal weight.  HENT:     Head: Normocephalic and atraumatic.  Eyes:     Extraocular Movements: Extraocular movements intact.     Pupils: Pupils are equal, round, and reactive to light.  Cardiovascular:     Rate and Rhythm: Normal rate and regular rhythm.     Pulses: Normal pulses.  Pulmonary:     Effort: Pulmonary effort is normal.     Breath sounds: Normal breath sounds.  Abdominal:      Palpations: Abdomen is soft.  Musculoskeletal:     Cervical back: Normal range of motion and neck supple.     Right knee: Effusion, bony tenderness and crepitus present. Decreased range of motion. Tenderness present over the medial joint line and lateral joint line. Abnormal alignment.  Neurological:     Mental Status: He is alert and oriented to person, place, and time.  Psychiatric:        Behavior: Behavior normal.     Vital signs in last 24 hours:    Labs:   Estimated body mass index is 29.05 kg/m as calculated from the following:   Height as of 02/15/23: 5\' 6"  (1.676 m).   Weight as of 02/15/23: 81.6 kg.   Imaging Review Plain radiographs demonstrate severe degenerative joint disease of the right knee(s). The overall alignment ismild varus. The bone quality appears to be good for age and reported activity level.      Assessment/Plan:  End stage arthritis, right knee   The patient history, physical examination, clinical judgment of the provider and imaging studies are consistent with end stage degenerative joint disease of the right knee(s) and total knee arthroplasty is deemed medically necessary. The treatment options including medical management, injection therapy arthroscopy and arthroplasty were discussed at length. The risks and benefits of total knee arthroplasty were presented and reviewed. The risks due to aseptic loosening, infection, stiffness, patella tracking problems, thromboembolic complications and other imponderables were discussed. The patient acknowledged the explanation, agreed to proceed with the plan and consent was signed. Patient is being admitted for inpatient treatment for surgery, pain control, PT, OT, prophylactic antibiotics, VTE prophylaxis, progressive ambulation and ADL's and discharge planning. The patient is planning to be discharged home with home health services

## 2023-02-27 NOTE — Telephone Encounter (Signed)
Ortho bundle pre-op call completed.

## 2023-02-28 ENCOUNTER — Ambulatory Visit (HOSPITAL_COMMUNITY): Payer: Self-pay | Admitting: Anesthesiology

## 2023-02-28 ENCOUNTER — Encounter (HOSPITAL_COMMUNITY): Payer: Self-pay | Admitting: Orthopaedic Surgery

## 2023-02-28 ENCOUNTER — Encounter (HOSPITAL_COMMUNITY)
Admission: RE | Disposition: A | Discharge status: Home / Self Care | Payer: Self-pay | Source: Home / Self Care | Attending: Orthopaedic Surgery

## 2023-02-28 ENCOUNTER — Observation Stay (HOSPITAL_COMMUNITY)
Admission: RE | Admit: 2023-02-28 | Discharge: 2023-03-01 | Disposition: A | Discharge status: Home / Self Care | Payer: PPO | Attending: Orthopaedic Surgery | Admitting: Orthopaedic Surgery

## 2023-02-28 ENCOUNTER — Other Ambulatory Visit: Payer: Self-pay

## 2023-02-28 ENCOUNTER — Observation Stay (HOSPITAL_COMMUNITY): Payer: PPO

## 2023-02-28 DIAGNOSIS — Z79899 Other long term (current) drug therapy: Secondary | ICD-10-CM | POA: Diagnosis not present

## 2023-02-28 DIAGNOSIS — Z87891 Personal history of nicotine dependence: Secondary | ICD-10-CM | POA: Insufficient documentation

## 2023-02-28 DIAGNOSIS — G8918 Other acute postprocedural pain: Secondary | ICD-10-CM | POA: Diagnosis not present

## 2023-02-28 DIAGNOSIS — M1711 Unilateral primary osteoarthritis, right knee: Principal | ICD-10-CM | POA: Insufficient documentation

## 2023-02-28 DIAGNOSIS — Z96651 Presence of right artificial knee joint: Secondary | ICD-10-CM | POA: Diagnosis not present

## 2023-02-28 DIAGNOSIS — I10 Essential (primary) hypertension: Secondary | ICD-10-CM | POA: Insufficient documentation

## 2023-02-28 DIAGNOSIS — Z471 Aftercare following joint replacement surgery: Secondary | ICD-10-CM | POA: Diagnosis not present

## 2023-02-28 DIAGNOSIS — R609 Edema, unspecified: Secondary | ICD-10-CM | POA: Diagnosis not present

## 2023-02-28 HISTORY — PX: TOTAL KNEE ARTHROPLASTY: SHX125

## 2023-02-28 SURGERY — ARTHROPLASTY, KNEE, TOTAL
Anesthesia: Spinal | Site: Knee | Laterality: Right

## 2023-02-28 MED ORDER — SODIUM CHLORIDE 0.9 % IV SOLN: INTRAVENOUS | Status: DC

## 2023-02-28 MED ORDER — PANTOPRAZOLE SODIUM 40 MG PO TBEC
40.0000 mg | DELAYED_RELEASE_TABLET | Freq: Every day | ORAL | Status: DC
  Administered 2023-03-01: 40 mg via ORAL
  Filled 2023-02-28: qty 1

## 2023-02-28 MED ORDER — ORAL CARE MOUTH RINSE: 15.0000 mL | Freq: Once | OROMUCOSAL | Status: AC

## 2023-02-28 MED ORDER — HYDROMORPHONE HCL 1 MG/ML IJ SOLN
1.0000 mg | Freq: Once | INTRAMUSCULAR | Status: AC
  Administered 2023-02-28: 1 mg via INTRAVENOUS

## 2023-02-28 MED ORDER — POVIDONE-IODINE 10 % EX SWAB
2.0000 | Freq: Once | CUTANEOUS | Status: AC
  Administered 2023-02-28: 2 via TOPICAL

## 2023-02-28 MED ORDER — MIDAZOLAM HCL 2 MG/2ML IJ SOLN
INTRAMUSCULAR | Status: AC
  Administered 2023-02-28: 1 mg via INTRAVENOUS
  Filled 2023-02-28: qty 2

## 2023-02-28 MED ORDER — DIPHENHYDRAMINE HCL 12.5 MG/5ML PO ELIX: 12.5000 mg | ORAL_SOLUTION | ORAL | Status: DC | PRN

## 2023-02-28 MED ORDER — HYDROMORPHONE HCL 1 MG/ML IJ SOLN
INTRAMUSCULAR | Status: AC
  Filled 2023-02-28: qty 1

## 2023-02-28 MED ORDER — ALUM & MAG HYDROXIDE-SIMETH 200-200-20 MG/5ML PO SUSP: 30.0000 mL | ORAL | Status: DC | PRN

## 2023-02-28 MED ORDER — METHOCARBAMOL 1000 MG/10ML IJ SOLN
500.0000 mg | Freq: Once | INTRAVENOUS | Status: DC
  Filled 2023-02-28 (×2): qty 5

## 2023-02-28 MED ORDER — LIDOCAINE 2% (20 MG/ML) 5 ML SYRINGE
INTRAMUSCULAR | Status: DC | PRN
  Administered 2023-02-28: 40 mg via INTRAVENOUS

## 2023-02-28 MED ORDER — PROPOFOL 500 MG/50ML IV EMUL
INTRAVENOUS | Status: DC | PRN
  Administered 2023-02-28: 125 ug/kg/min via INTRAVENOUS

## 2023-02-28 MED ORDER — MENTHOL 3 MG MT LOZG: 1.0000 | LOZENGE | OROMUCOSAL | Status: DC | PRN

## 2023-02-28 MED ORDER — ATORVASTATIN CALCIUM 10 MG PO TABS
10.0000 mg | ORAL_TABLET | Freq: Every day | ORAL | Status: DC
  Administered 2023-03-01: 10 mg via ORAL
  Filled 2023-02-28: qty 1

## 2023-02-28 MED ORDER — METOCLOPRAMIDE HCL 5 MG PO TABS: 5.0000 mg | ORAL_TABLET | Freq: Three times a day (TID) | ORAL | Status: DC | PRN

## 2023-02-28 MED ORDER — CEFAZOLIN SODIUM-DEXTROSE 2-4 GM/100ML-% IV SOLN
2.0000 g | INTRAVENOUS | Status: AC
  Administered 2023-02-28: 2 g via INTRAVENOUS
  Filled 2023-02-28: qty 100

## 2023-02-28 MED ORDER — BUPIVACAINE IN DEXTROSE 0.75-8.25 % IT SOLN
INTRATHECAL | Status: DC | PRN
Start: 2023-02-28 — End: 2023-02-28
  Administered 2023-02-28: 1.6 mL via INTRATHECAL

## 2023-02-28 MED ORDER — ONDANSETRON HCL 4 MG/2ML IJ SOLN: 4.0000 mg | Freq: Four times a day (QID) | INTRAMUSCULAR | Status: DC | PRN

## 2023-02-28 MED ORDER — DEXAMETHASONE SODIUM PHOSPHATE 10 MG/ML IJ SOLN
INTRAMUSCULAR | Status: DC | PRN
  Administered 2023-02-28: 10 mg via INTRAVENOUS

## 2023-02-28 MED ORDER — ACETAMINOPHEN 500 MG PO TABS
1000.0000 mg | ORAL_TABLET | Freq: Once | ORAL | Status: AC
  Administered 2023-02-28: 1000 mg via ORAL
  Filled 2023-02-28: qty 2

## 2023-02-28 MED ORDER — DOCUSATE SODIUM 100 MG PO CAPS
100.0000 mg | ORAL_CAPSULE | Freq: Two times a day (BID) | ORAL | Status: DC
  Administered 2023-02-28 – 2023-03-01 (×3): 100 mg via ORAL
  Filled 2023-02-28 (×3): qty 1

## 2023-02-28 MED ORDER — LOSARTAN POTASSIUM-HCTZ 100-25 MG PO TABS: 1.0000 | ORAL_TABLET | Freq: Every day | ORAL | Status: DC

## 2023-02-28 MED ORDER — ONDANSETRON HCL 4 MG/2ML IJ SOLN
INTRAMUSCULAR | Status: DC | PRN
  Administered 2023-02-28: 4 mg via INTRAVENOUS

## 2023-02-28 MED ORDER — METOCLOPRAMIDE HCL 5 MG/ML IJ SOLN: 5.0000 mg | Freq: Three times a day (TID) | INTRAMUSCULAR | Status: DC | PRN

## 2023-02-28 MED ORDER — FENTANYL CITRATE (PF) 100 MCG/2ML IJ SOLN
INTRAMUSCULAR | Status: AC
  Administered 2023-02-28: 50 ug via INTRAVENOUS
  Filled 2023-02-28: qty 2

## 2023-02-28 MED ORDER — OXYCODONE HCL 5 MG PO TABS
10.0000 mg | ORAL_TABLET | ORAL | Status: DC | PRN
  Administered 2023-02-28 – 2023-03-01 (×3): 10 mg via ORAL
  Administered 2023-03-01: 15 mg via ORAL
  Administered 2023-03-01: 10 mg via ORAL
  Filled 2023-02-28 (×3): qty 2
  Filled 2023-02-28: qty 3
  Filled 2023-02-28: qty 2

## 2023-02-28 MED ORDER — ASPIRIN 81 MG PO CHEW
81.0000 mg | CHEWABLE_TABLET | Freq: Two times a day (BID) | ORAL | Status: DC
  Administered 2023-02-28 – 2023-03-01 (×2): 81 mg via ORAL
  Filled 2023-02-28 (×2): qty 1

## 2023-02-28 MED ORDER — ALLOPURINOL 100 MG PO TABS
100.0000 mg | ORAL_TABLET | Freq: Every day | ORAL | Status: DC
  Administered 2023-03-01: 100 mg via ORAL
  Filled 2023-02-28: qty 1

## 2023-02-28 MED ORDER — SODIUM CHLORIDE 0.9 % IR SOLN
Status: DC | PRN
  Administered 2023-02-28: 2000 mL

## 2023-02-28 MED ORDER — ROPIVACAINE HCL 7.5 MG/ML IJ SOLN
INTRAMUSCULAR | Status: DC | PRN
Start: 2023-02-28 — End: 2023-02-28
  Administered 2023-02-28: 20 mL via PERINEURAL

## 2023-02-28 MED ORDER — CEFAZOLIN SODIUM-DEXTROSE 1-4 GM/50ML-% IV SOLN
1.0000 g | Freq: Four times a day (QID) | INTRAVENOUS | Status: AC
  Administered 2023-02-28 – 2023-03-01 (×2): 1 g via INTRAVENOUS
  Filled 2023-02-28 (×2): qty 50

## 2023-02-28 MED ORDER — OXYCODONE HCL 5 MG PO TABS
ORAL_TABLET | ORAL | Status: AC
  Filled 2023-02-28: qty 2

## 2023-02-28 MED ORDER — PROPOFOL 10 MG/ML IV BOLUS
INTRAVENOUS | Status: AC
  Filled 2023-02-28: qty 20

## 2023-02-28 MED ORDER — BUPIVACAINE-EPINEPHRINE (PF) 0.25% -1:200000 IJ SOLN
INTRAMUSCULAR | Status: AC
  Filled 2023-02-28: qty 30

## 2023-02-28 MED ORDER — PHENOL 1.4 % MT LIQD: 1.0000 | OROMUCOSAL | Status: DC | PRN

## 2023-02-28 MED ORDER — 0.9 % SODIUM CHLORIDE (POUR BTL) OPTIME
TOPICAL | Status: DC | PRN
  Administered 2023-02-28: 1000 mL

## 2023-02-28 MED ORDER — BUPIVACAINE-EPINEPHRINE (PF) 0.25% -1:200000 IJ SOLN
INTRAMUSCULAR | Status: DC | PRN
Start: 2023-02-28 — End: 2023-02-28
  Administered 2023-02-28: 30 mL

## 2023-02-28 MED ORDER — HYDROMORPHONE HCL 1 MG/ML IJ SOLN
0.2500 mg | INTRAMUSCULAR | Status: DC | PRN
  Administered 2023-02-28 (×4): 0.5 mg via INTRAVENOUS

## 2023-02-28 MED ORDER — HYDROCHLOROTHIAZIDE 25 MG PO TABS
25.0000 mg | ORAL_TABLET | Freq: Every day | ORAL | Status: DC
  Administered 2023-03-01: 25 mg via ORAL
  Filled 2023-02-28: qty 1

## 2023-02-28 MED ORDER — LACTATED RINGERS IV SOLN: INTRAVENOUS | Status: DC

## 2023-02-28 MED ORDER — METHOCARBAMOL 1000 MG/10ML IJ SOLN
500.0000 mg | Freq: Four times a day (QID) | INTRAVENOUS | Status: DC | PRN
  Administered 2023-02-28: 500 mg via INTRAVENOUS
  Filled 2023-02-28: qty 500

## 2023-02-28 MED ORDER — ACETAMINOPHEN 325 MG PO TABS
325.0000 mg | ORAL_TABLET | Freq: Four times a day (QID) | ORAL | Status: DC | PRN
  Administered 2023-02-28 – 2023-03-01 (×3): 650 mg via ORAL
  Filled 2023-02-28 (×3): qty 2

## 2023-02-28 MED ORDER — OXYCODONE HCL 5 MG PO TABS
5.0000 mg | ORAL_TABLET | ORAL | Status: DC | PRN
  Administered 2023-02-28 – 2023-03-01 (×2): 10 mg via ORAL
  Filled 2023-02-28: qty 2

## 2023-02-28 MED ORDER — HYDROMORPHONE HCL 1 MG/ML IJ SOLN
0.5000 mg | INTRAMUSCULAR | Status: DC | PRN
  Administered 2023-02-28: 1 mg via INTRAVENOUS
  Filled 2023-02-28: qty 1

## 2023-02-28 MED ORDER — METHOCARBAMOL 500 MG PO TABS
500.0000 mg | ORAL_TABLET | Freq: Four times a day (QID) | ORAL | Status: DC | PRN
  Administered 2023-02-28 – 2023-03-01 (×3): 500 mg via ORAL
  Filled 2023-02-28 (×3): qty 1

## 2023-02-28 MED ORDER — FENTANYL CITRATE (PF) 100 MCG/2ML IJ SOLN: 50.0000 ug | Freq: Once | INTRAMUSCULAR | Status: AC

## 2023-02-28 MED ORDER — TRANEXAMIC ACID-NACL 1000-0.7 MG/100ML-% IV SOLN
1000.0000 mg | INTRAVENOUS | Status: AC
  Administered 2023-02-28: 1000 mg via INTRAVENOUS
  Filled 2023-02-28: qty 100

## 2023-02-28 MED ORDER — ONDANSETRON HCL 4 MG PO TABS: 4.0000 mg | ORAL_TABLET | Freq: Four times a day (QID) | ORAL | Status: DC | PRN

## 2023-02-28 MED ORDER — MIDAZOLAM HCL 2 MG/2ML IJ SOLN: 1.0000 mg | Freq: Once | INTRAMUSCULAR | Status: AC

## 2023-02-28 MED ORDER — CHLORHEXIDINE GLUCONATE 0.12 % MT SOLN
15.0000 mL | Freq: Once | OROMUCOSAL | Status: AC
  Administered 2023-02-28: 15 mL via OROMUCOSAL
  Filled 2023-02-28: qty 15

## 2023-02-28 MED ORDER — LOSARTAN POTASSIUM 50 MG PO TABS
100.0000 mg | ORAL_TABLET | Freq: Every day | ORAL | Status: DC
  Administered 2023-03-01: 100 mg via ORAL
  Filled 2023-02-28: qty 2

## 2023-02-28 SURGICAL SUPPLY — 76 items
ATTUNE MED DOME PAT 38 KNEE (Knees) IMPLANT
ATTUNE PS FEM RT SZ 5 CEM KNEE (Femur) IMPLANT
ATTUNE PSRP INSE SZ5 7 KNEE (Insert) IMPLANT
BAG COUNTER SPONGE SURGICOUNT (BAG) ×1 IMPLANT
BAG SPNG CNTER NS LX DISP (BAG)
BANDAGE ESMARK 6X9 LF (GAUZE/BANDAGES/DRESSINGS) ×1 IMPLANT
BASE TIB KNEE REV RP ATUNE SZ6 (Knees) IMPLANT
BLADE SAG 18X100X1.27 (BLADE) ×1 IMPLANT
BLADE SAW SGTL 11.0X1.19X90.0M (BLADE) IMPLANT
BNDG CMPR 9X6 STRL LF SNTH (GAUZE/BANDAGES/DRESSINGS)
BNDG CMPR MED 10X6 ELC LF (GAUZE/BANDAGES/DRESSINGS) ×1
BNDG ELASTIC 6X10 VLCR STRL LF (GAUZE/BANDAGES/DRESSINGS) IMPLANT
BNDG ELASTIC 6X5.8 VLCR STR LF (GAUZE/BANDAGES/DRESSINGS) ×2 IMPLANT
BNDG ESMARK 6X9 LF (GAUZE/BANDAGES/DRESSINGS)
BOWL SMART MIX CTS (DISPOSABLE) IMPLANT
BSPLAT TIB 6 CMNT REV ROT PLAT (Knees) ×1 IMPLANT
CEMENT BONE R 1X40 (Cement) IMPLANT
CONE SLEEVE ATTUNE KNEE LG (Sleeve) IMPLANT
COOLER ICEMAN CLASSIC (MISCELLANEOUS) ×1 IMPLANT
COVER SURGICAL LIGHT HANDLE (MISCELLANEOUS) ×1 IMPLANT
CUFF TOURN SGL QUICK 34 (TOURNIQUET CUFF) ×1
CUFF TOURN SGL QUICK 42 (TOURNIQUET CUFF) IMPLANT
CUFF TRNQT CYL 34X4.125X (TOURNIQUET CUFF) ×1 IMPLANT
DRAPE EXTREMITY T 121X128X90 (DISPOSABLE) ×1 IMPLANT
DRAPE HALF SHEET 40X57 (DRAPES) ×1 IMPLANT
DRAPE U-SHAPE 47X51 STRL (DRAPES) ×1 IMPLANT
DURAPREP 26ML APPLICATOR (WOUND CARE) ×1 IMPLANT
ELECT CAUTERY BLADE 6.4 (BLADE) ×1 IMPLANT
ELECT REM PT RETURN 9FT ADLT (ELECTROSURGICAL) ×1
ELECTRODE REM PT RTRN 9FT ADLT (ELECTROSURGICAL) ×1 IMPLANT
FACESHIELD WRAPAROUND (MASK) ×3 IMPLANT
FACESHIELD WRAPAROUND OR TEAM (MASK) ×2 IMPLANT
GAUZE PAD ABD 8X10 STRL (GAUZE/BANDAGES/DRESSINGS) ×1 IMPLANT
GAUZE SPONGE 4X4 12PLY STRL (GAUZE/BANDAGES/DRESSINGS) ×1 IMPLANT
GAUZE XEROFORM 1X8 LF (GAUZE/BANDAGES/DRESSINGS) ×1 IMPLANT
GAUZE XEROFORM 5X9 LF (GAUZE/BANDAGES/DRESSINGS) IMPLANT
GLOVE BIOGEL PI IND STRL 8 (GLOVE) ×2 IMPLANT
GLOVE ORTHO TXT STRL SZ7.5 (GLOVE) ×1 IMPLANT
GLOVE SURG ORTHO 8.0 STRL STRW (GLOVE) ×1 IMPLANT
GOWN STRL REUS W/ TWL LRG LVL3 (GOWN DISPOSABLE) IMPLANT
GOWN STRL REUS W/ TWL XL LVL3 (GOWN DISPOSABLE) ×2 IMPLANT
GOWN STRL REUS W/TWL LRG LVL3 (GOWN DISPOSABLE)
GOWN STRL REUS W/TWL XL LVL3 (GOWN DISPOSABLE) ×2
HANDPIECE INTERPULSE COAX TIP (DISPOSABLE) ×1
IMMOBILIZER KNEE 22 UNIV (SOFTGOODS) ×1 IMPLANT
IV NS 1000ML (IV SOLUTION) ×1
IV NS 1000ML BAXH (IV SOLUTION) ×1 IMPLANT
KIT BASIN OR (CUSTOM PROCEDURE TRAY) ×1 IMPLANT
KIT TURNOVER KIT B (KITS) ×1 IMPLANT
MANIFOLD NEPTUNE II (INSTRUMENTS) ×1 IMPLANT
NDL 18GX1X1/2 (RX/OR ONLY) (NEEDLE) IMPLANT
NEEDLE 18GX1X1/2 (RX/OR ONLY) (NEEDLE) IMPLANT
NS IRRIG 1000ML POUR BTL (IV SOLUTION) ×1 IMPLANT
PACK TOTAL JOINT (CUSTOM PROCEDURE TRAY) ×1 IMPLANT
PAD ABD 8X10 STRL (GAUZE/BANDAGES/DRESSINGS) IMPLANT
PAD ARMBOARD 7.5X6 YLW CONV (MISCELLANEOUS) ×1 IMPLANT
PAD CAST 4YDX4 CTTN HI CHSV (CAST SUPPLIES) IMPLANT
PAD COLD SHLDR WRAP-ON (PAD) ×1 IMPLANT
PADDING CAST COTTON 4X4 STRL (CAST SUPPLIES) ×1
PADDING CAST COTTON 6X4 STRL (CAST SUPPLIES) ×1 IMPLANT
PIN FIX SIGMA LCS THRD HI (PIN) IMPLANT
SET HNDPC FAN SPRY TIP SCT (DISPOSABLE) ×1 IMPLANT
SET PAD KNEE POSITIONER (MISCELLANEOUS) ×1 IMPLANT
STAPLER VISISTAT 35W (STAPLE) ×1 IMPLANT
STEM REV CEMENTED 14X50MM (Stem) IMPLANT
SUCTION TUBE FRAZIER 10FR DISP (SUCTIONS) ×1 IMPLANT
SUT VIC AB 0 CT1 27 (SUTURE) ×2
SUT VIC AB 0 CT1 27XBRD ANBCTR (SUTURE) ×1 IMPLANT
SUT VIC AB 1 CT1 27 (SUTURE) ×2
SUT VIC AB 1 CT1 27XBRD ANBCTR (SUTURE) ×2 IMPLANT
SUT VIC AB 2-0 CT1 27 (SUTURE) ×2
SUT VIC AB 2-0 CT1 TAPERPNT 27 (SUTURE) ×2 IMPLANT
SYR 50ML LL SCALE MARK (SYRINGE) IMPLANT
TOWEL GREEN STERILE (TOWEL DISPOSABLE) ×1 IMPLANT
TOWEL GREEN STERILE FF (TOWEL DISPOSABLE) ×1 IMPLANT
TRAY CATH INTERMITTENT SS 16FR (CATHETERS) IMPLANT

## 2023-02-28 NOTE — Transfer of Care (Signed)
Immediate Anesthesia Transfer of Care Note  Patient: Darrell Freeman  Procedure(s) Performed: RIGHT TOTAL KNEE ARTHROPLASTY (Right: Knee)  Patient Location: PACU  Anesthesia Type:Spinal  Level of Consciousness: awake  Airway & Oxygen Therapy: Patient Spontanous Breathing and Patient connected to face mask oxygen  Post-op Assessment: Report given to RN and Post -op Vital signs reviewed and stable  Post vital signs: Reviewed and stable  Last Vitals:  Vitals Value Taken Time  BP    Temp    Pulse    Resp    SpO2      Last Pain:  Vitals:   02/28/23 0910  TempSrc:   PainSc: 0-No pain         Complications: No notable events documented.

## 2023-02-28 NOTE — Op Note (Signed)
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 Item Serial No. Manufacturer Lot No. LRB No. Used Action  CEMENT BONE R 1X40 - [id number] Cement CEMENT BONE R 1X40  ZIMMER RECON(ORTH,TRAU,BIO,SG) [id number] Right 2 Implanted  CEMENT BONE R 1X40 - [id number] Cement CEMENT BONE R 1X40  ZIMMER RECON(ORTH,TRAU,BIO,SG) [id number] Right 1 Implanted  STEM REV CEMENTED 14X50MM - [id number] Stem STEM REV CEMENTED 14X50MM  DEPUY ORTH[employer] [id number] Right 1 Implanted  ATTUNE MED DOME PAT 38 KNEE - [id number] Knees ATTUNE MED DOME PAT 38 KNEE  DEPUY ORTHOPAEDICS [id number] Right 1 Implanted  ATTUNE PS FEM RT SZ 5 CEM KNEE - [id number] Femur ATTUNE PS FEM RT SZ 5 CEM KNEE  DEPUY ORTHOPAEDICS [id number] Right 1 Implanted  CONE SLEEVE ATTUNE KNEE LG - [id number] Sleeve CONE SLEEVE ATTUNE KNEE LG  DEPUY ORTHOPAED[employer] [id number] Right 1 Implanted  BSPLAT TIB 6 CMNT REV ROT PLAT - [id number] Knees BSPLAT TIB 6 CMNT REV ROT PLAT  DEPUY ORTHOPAEDICS [id number] Right 1 Implanted  ATTUNE PSRP INSE SZ5 7 KNEE - [id number] Insert ATTUNE PSRP INSE SZ5 7 KNEE  DEPUY ORTHOPA[employer] [id number] Right 1 Implanted   Surgeon: Vanita Panda. Magnus Ivan, MD Assistant: Rexene Edison, PA-C  Anesthesia: #1 right lower extremity adductor canal block, #2 spinal, #3 local Tourniquet time: Under 2 hours EBL: Under 100 cc Antibiotics: IV Ancef Complications: None  Indications: The patient is a 67 year old gentleman with severe gout who is developed also primary osteoarthritis of his right knee.  He actually has significant gout changes in the metaphyseal area of the subchondral bone with his proximal tibia on the right side.  This has been worked up and it was consistent with gout.  He has severe and stage arthritis of the  joint itself and at this point his right knee pain is daily and is detrimentally affecting his mobility, his quality of life and his actives of daily living and point he wished to proceed with a knee replacement.  He understands it on the tibia side we will place revision components given the wide area of bone destruction from his gout in the metaphyseal section of the proximal tibia.  We did talk in length in detail the risk of acute blood loss anemia, nerve vessel injury, fracture, infection, DVT and implant failure.  There is also risk of wound healing issues.  He understands that our goals are hopefully decrease pain, improve mobility, and improve quality of life.  Procedure description: After informed consent was obtained and the appropriate right knee was marked, anesthesia obtained a right lower extremity adductor canal block in the holding room.  The patient was then brought to the operating room and set up on the operating table where spinal anesthesia was obtained.  The patient was then placed in a supine position on the operating table and a Foley catheter was placed.  A nonsterile tractors placed around his upper right thigh and his right thigh, knee, leg and ankle as well as foot were prepped and draped with DuraPrep and sterile drapes.  A timeout was called and he was identified as correct patient correct right knee.  An Esmarch was used to wrap out the leg and the right leg tourniquet was inflated to 300 mm of pressure.  With the knee extended a direct midline incision was made over the patella and carried  proximally distally.  Dissection was carried down to the knee joint and a medial parapatellar arthrotomy was made finding a large joint effusion and significant synovitis in the knees as well as findings consistent with gout.  Of note we did remove a large prepatellar gout tophi from his prepatellar area on the right knee as well due to the fact it was in the surgical field.  With the knee in a  flexed position we removed remnants of the ACL, PCL and medial lateral meniscus.  There was significant cartilage wear in the knee.  Osteophytes removed from all 3 compartments.  We then made our patella cut taking about 9 mm off the patella.  Next we made our proximal tibia cut correction for varus and valgus and a neutral slope.  We made this cut to take 2 mm off of the low side.  We made the cut without difficulty.  We did find a large area devoid within the proximal tibia from his gout.  Using curettes we removed all of the gout type of material.  We then used a intramedullary guide to make our distal femoral cut setting this for right knee at 5 degrees externally rotated and a 9.5 mm distal femoral cut.  We made that cut without difficulty and brought the knee back down to full extension and achieved full extension with the 5 mm extension block.  We do back today femur and chose a size 5 femur based off the epicondylar axis.  We then placed a 4-in-1 cutting block for a size 5 femur and made our anterior posterior cuts followed our chamfer cuts.  We then made our femoral box cut.  We then backed the tibia and next prepared our tibia for a stem with a cone as well given the void about in the middle of the knee.  We reamed up to a size large tendon we are pleased with that plan.  We then chose a size 6 tibial tray for coverage of the tibial plateau setting rotation of the tibial tibia and femur.  We prepared our keel hole and drilled punch for this as well and prepared for a longstem tibia.  We then trialed our tibial component size 6 followed by our size 5 right femur.  We trialed a 5 mm rotating platform polyethylene insert and went up to a 6 mm insert and replaced with a 6 mm insert thickness in terms of stability and range of motion.  We then drilled 3 holes for a size 38 patella button.  Again with all trial instrumentation in the knee we are pleased with range of motion and stability.  We then removed all  traction mentation with the knee and irrigated the knee with normal saline solution using pulse lavage.  We placed Marcaine with epinephrine around the arthrotomy.  We then mixed our cement and with the knee in a flexed position cemented our DePuy attune revision tibial tray with a cone and a stem size 6.  This was a rotating platform tibial baseplate and a 14 mm x 50 mm stem.  We then cemented our size 5 right obtaining PS femur.  We placed our RP 6 mm thickness polyethylene insert and cemented our size 38 button.  We held the knee fully extended and compressed while the cement hardened.  Once the cemented hardened we put the knee through several cycles of motion and we are pleased with range of motion and stability.  The tourniquet was let down and hemostasis  was obtained with electrocautery.  The arthrotomy was closed with interrupted #1 Vicryl suture followed by 0 Vicryl's deep tissue and 2-0 Vicryl close subcutaneous tissue.  The skin was closed with staples.  Well-padded sterile dressing was applied.  Patient was taken recovery in stable condition.  Rexene Edison, PA-C did assist in the entire case and beginning the end and his assistance was medically necessary and crucial for soft tissue management and retraction, helping guide implant placement and a layered closure of the wound.

## 2023-02-28 NOTE — Anesthesia Postprocedure Evaluation (Signed)
Anesthesia Post Note  Patient: ABDULHAKEEM KENERSON  Procedure(s) Performed: RIGHT TOTAL KNEE ARTHROPLASTY (Right: Knee)     Patient location during evaluation: PACU Anesthesia Type: Spinal and Regional Level of consciousness: oriented and awake and alert Pain management: pain level controlled Vital Signs Assessment: post-procedure vital signs reviewed and stable Respiratory status: spontaneous breathing and respiratory function stable Cardiovascular status: blood pressure returned to baseline and stable Postop Assessment: no headache, no backache, no apparent nausea or vomiting, spinal receding and patient able to bend at knees Anesthetic complications: no  No notable events documented.  Last Vitals:  Vitals:   02/28/23 1345 02/28/23 1357  BP:  (!) 137/91  Pulse: 76 68  Resp: 16 20  Temp:  36.6 C  SpO2: 95% 95%    Last Pain:  Vitals:   02/28/23 1345  TempSrc:   PainSc: 4                  Ellary Casamento,W. EDMOND

## 2023-02-28 NOTE — Anesthesia Procedure Notes (Signed)
Anesthesia Regional Block: Adductor canal block   Pre-Anesthetic Checklist: , timeout performed,  Correct Patient, Correct Site, Correct Laterality,  Correct Procedure, Correct Position, site marked,  Risks and benefits discussed,  Surgical consent,  Pre-op evaluation,  At surgeon's request and post-op pain management  Laterality: Right and Lower  Prep: chloraprep       Needles:  Injection technique: Single-shot  Needle Type: Echogenic Needle     Needle Length: 9cm  Needle Gauge: 21     Additional Needles:   Procedures:,,,, ultrasound used (permanent image in chart),,    Narrative:  Start time: 02/28/2023 8:42 AM End time: 02/28/2023 8:49 AM Injection made incrementally with aspirations every 5 mL.  Performed by: Personally  Anesthesiologist: Jairo Ben, MD  Additional Notes: Pt identified in Holding room.  Monitors applied. Working IV access confirmed. Timeout, Sterile prep R thigh.  #21ga ECHOgenic Arrow block needle into adductor canal with US guidance.  20cc 0.75% Ropivacaine injected incrementally after negative test dose.  Patient asymptomatic, VSS, no heme aspirated, tolerated well.   Sandford Craze, MD

## 2023-02-28 NOTE — Interval H&P Note (Signed)
History and Physical Interval Note: The patient understands fully that he is here today for right total knee arthroplasty to treat his severe right knee arthritis as well as his gout that is within the knee in the metaphyseal section of the bone.  There has been no acute or interval change in medical status.  We had a long and thorough discussion about the risk and benefits of surgery.  Informed consent has been obtained and the right operative knee has been marked.  02/28/2023 8:52 AM  Darrell Freeman  has presented today for surgery, with the diagnosis of osteoarthritis right knee.  The various methods of treatment have been discussed with the patient and family. After consideration of risks, benefits and other options for treatment, the patient has consented to  Procedure(s): RIGHT TOTAL KNEE ARTHROPLASTY (Right) as a surgical intervention.  The patient's history has been reviewed, patient examined, no change in status, stable for surgery.  I have reviewed the patient's chart and labs.  Questions were answered to the patient's satisfaction.     Kathryne Hitch

## 2023-02-28 NOTE — Evaluation (Signed)
Physical Therapy Evaluation Patient Details Name: Darrell Freeman MRN: [id number] DOB: August 13, 1955 Today's Date: 02/28/2023  History of Present Illness  Pt is 67 year old presented to [facility] on  02/28/23 for Rt TKR. PMH - HTN, GI bleed  Clinical Impression  Pt admitted with above diagnosis and presents to PT with functional limitations due to deficits listed below (See PT problem list). Pt needs skilled PT to maximize independence and safety. Pt with pain limiting ROM of rt knee. Rt quad not all the way awake after anesthesia yet so left knee immobilizer on for gait. Pt should progress steadily and plans to return home with wife.           If plan is discharge home, recommend the following: A little help with walking and/or transfers;A little help with bathing/dressing/bathroom;Help with stairs or ramp for entrance;Assistance with cooking/housework   Can travel by private vehicle        Equipment Recommendations Rolling walker (2 wheels);BSC/3in1  Recommendations for Other Services       Functional Status Assessment Patient has had a recent decline in their functional status and demonstrates the ability to make significant improvements in function in a reasonable and predictable amount of time.     Precautions / Restrictions Precautions Precautions: Knee Required Braces or Orthoses: Knee Immobilizer - Right Knee Immobilizer - Right: Other (comment) (as needed) Restrictions Weight Bearing Restrictions: Yes RLE Weight Bearing: Weight bearing as tolerated      Mobility  Bed Mobility Overal bed mobility: Needs Assistance Bed Mobility: Supine to Sit     Supine to sit: Min assist     General bed mobility comments: Assist for RLE    Transfers Overall transfer level: Needs assistance Equipment used: Rolling walker (2 wheels) Transfers: Sit to/from Stand Sit to Stand: Min assist           General transfer comment: Assist to power up and stabilize     Ambulation/Gait Ambulation/Gait assistance: Min assist Gait Distance (Feet): 100 Feet Assistive device: Rolling walker (2 wheels) Gait Pattern/deviations: Step-through pattern, Decreased stance time - right, Decreased step length - left Gait velocity: decr Gait velocity interpretation: <1.31 ft/sec, indicative of household ambulator   General Gait Details: Assist for stability  Stairs            Wheelchair Mobility     Tilt Bed    Modified Rankin (Stroke Patients Only)       Balance Overall balance assessment: Mild deficits observed, not formally tested                                           Pertinent Vitals/Pain Pain Assessment Pain Assessment: Faces Faces Pain Scale: Hurts whole lot Pain Location: rt knee Pain Descriptors / Indicators: Grimacing, Guarding Pain Intervention(s): Limited activity within patient's tolerance, Monitored during session, Repositioned    Home Living Family/patient expects to be discharged to:: Private residence Living Arrangements: Spouse/significant other Available Help at Discharge: Family;Available 24 hours/day Type of Home: House Home Access: Stairs to enter Entrance Stairs-Rails: None Entrance Stairs-Number of Steps: 3   Home Layout: One level Home Equipment: None      Prior Function Prior Level of Function : Independent/Modified Independent                     Extremity/Trunk Assessment   Upper Extremity Assessment Upper Extremity Assessment: Overall  WFL for tasks assessed    Lower Extremity Assessment Lower Extremity Assessment: RLE deficits/detail RLE Deficits / Details: Trace quad set. Knee ROM very limited 10-40 degrees       Communication   Communication Communication: No apparent difficulties  Cognition Arousal: Alert Behavior During Therapy: WFL for tasks assessed/performed Overall Cognitive Status: Within Functional Limits for tasks assessed                                           General Comments      Exercises Total Joint Exercises Ankle Circles/Pumps: AROM, Both, 10 reps, Seated Quad Sets: AROM, Both, 5 reps, Seated Knee Flexion: AAROM, 5 reps, Right, Seated Goniometric ROM: 10-40   Assessment/Plan    PT Assessment Patient needs continued PT services  PT Problem List Decreased strength;Decreased range of motion;Decreased activity tolerance;Decreased knowledge of use of DME;Pain       PT Treatment Interventions DME instruction;Gait training;Stair training;Functional mobility training;Therapeutic activities;Therapeutic exercise;Patient/family education    PT Goals (Current goals can be found in the Care Plan section)  Acute Rehab PT Goals Patient Stated Goal: return home PT Goal Formulation: With patient Time For Goal Achievement: 03/14/23 Potential to Achieve Goals: Good    Frequency 7X/week     Co-evaluation               AM-PAC PT "6 Clicks" Mobility  Outcome Measure Help needed turning from your back to your side while in a flat bed without using bedrails?: A Little Help needed moving from lying on your back to sitting on the side of a flat bed without using bedrails?: A Little Help needed moving to and from a bed to a chair (including a wheelchair)?: A Little Help needed standing up from a chair using your arms (e.g., wheelchair or bedside chair)?: A Little Help needed to walk in hospital room?: A Little Help needed climbing 3-5 steps with a railing? : A Lot 6 Click Score: 17    End of Session Equipment Utilized During Treatment: Gait belt;Right knee immobilizer Activity Tolerance: Patient limited by pain Patient left: in chair;with call bell/phone within reach;with family/visitor present Nurse Communication: Mobility status;Other (comment) (Use knee immobilizer until quad more responsive) PT Visit Diagnosis: Other abnormalities of gait and mobility (R26.89);Pain Pain - Right/Left: Right Pain - part  of body: Knee    Time: 1535-1600 PT Time Calculation (min) (ACUTE ONLY): 25 min   Charges:   PT Evaluation $PT Eval Low Complexity: 1 Low PT Treatments $Gait Training: 8-22 mins PT General Charges $$ ACUTE PT VISIT: 1 Visit         [facility] PT Acute Rehabilitation Services Office [phone]   Angelina Ok [facility] 02/28/2023, 4:22 PM

## 2023-02-28 NOTE — Anesthesia Procedure Notes (Signed)
Spinal  Patient location during procedure: OR Start time: 02/28/2023 9:58 AM End time: 02/28/2023 10:03 AM Reason for block: surgical anesthesia Staffing Performed: anesthesiologist  Anesthesiologist: Eilene Ghazi, MD Performed by: Eilene Ghazi, MD Authorized by: Gaynelle Adu, MD   Preanesthetic Checklist Completed: patient identified, IV checked, site marked, risks and benefits discussed, surgical consent, monitors and equipment checked, pre-op evaluation and timeout performed Spinal Block Patient position: sitting Prep: Betadine Patient monitoring: heart rate, continuous pulse ox and blood pressure Approach: midline Location: L4-5 Injection technique: single-shot Needle Needle type: Sprotte  Needle gauge: 24 G Needle length: 9 cm Assessment Sensory level: T6 Events: CSF return Additional Notes

## 2023-03-01 ENCOUNTER — Encounter (HOSPITAL_COMMUNITY): Payer: Self-pay | Admitting: Orthopaedic Surgery

## 2023-03-01 DIAGNOSIS — M1711 Unilateral primary osteoarthritis, right knee: Secondary | ICD-10-CM | POA: Diagnosis not present

## 2023-03-01 LAB — CBC
HCT: 33.7 % — ABNORMAL LOW (ref 39.0–52.0)
Hemoglobin: 11.6 g/dL — ABNORMAL LOW (ref 13.0–17.0)
MCH: 29.9 pg (ref 26.0–34.0)
MCHC: 34.4 g/dL (ref 30.0–36.0)
MCV: 86.9 fL (ref 80.0–100.0)
Platelets: 154 10*3/uL (ref 150–400)
RBC: 3.88 MIL/uL — ABNORMAL LOW (ref 4.22–5.81)
RDW: 12.9 % (ref 11.5–15.5)
WBC: 11.3 10*3/uL — ABNORMAL HIGH (ref 4.0–10.5)
nRBC: 0 % (ref 0.0–0.2)

## 2023-03-01 LAB — BASIC METABOLIC PANEL
Anion gap: 9 (ref 5–15)
BUN: 17 mg/dL (ref 8–23)
CO2: 24 mmol/L (ref 22–32)
Calcium: 9.1 mg/dL (ref 8.9–10.3)
Chloride: 99 mmol/L (ref 98–111)
Creatinine, Ser: 1.15 mg/dL (ref 0.61–1.24)
GFR, Estimated: >60 mL/min (ref >60)
Glucose, Bld: 129 mg/dL — ABNORMAL HIGH (ref 70–99)
Potassium: 3.7 mmol/L (ref 3.5–5.1)
Sodium: 132 mmol/L — ABNORMAL LOW (ref 135–145)

## 2023-03-01 MED ORDER — ASPIRIN 81 MG PO CHEW: 81.0000 mg | CHEWABLE_TABLET | Freq: Two times a day (BID) | ORAL | 0 refills | Status: DC

## 2023-03-01 MED ORDER — OXYCODONE HCL 5 MG PO TABS: 5.0000 mg | ORAL_TABLET | Freq: Four times a day (QID) | ORAL | 0 refills | Status: DC | PRN

## 2023-03-01 MED ORDER — METHOCARBAMOL 500 MG PO TABS: 500.0000 mg | ORAL_TABLET | Freq: Four times a day (QID) | ORAL | 1 refills | Status: DC | PRN

## 2023-03-01 NOTE — Discharge Summary (Signed)
Patient ID: Darrell Freeman MRN: [id number] DOB/AGE: 66-May-1957 66 y.o.  Admit date: 02/28/2023 Discharge date: 03/01/2023  Admission Diagnoses:  Principal Problem:   Unilateral primary osteoarthritis, right knee Active Problems:   Status post total right knee replacement   Discharge Diagnoses:  Same  Past Medical History:  Diagnosis Date   Anxiety    Arthritis    Diverticulosis    GERD (gastroesophageal reflux disease)    GI bleed    Hypertension    Osteoarthritis of right knee     Surgeries: Procedure(s): RIGHT TOTAL KNEE ARTHROPLASTY on 02/28/2023   Consultants:   Discharged Condition: Improved  Hospital Course: CONNAR UHLIG is an 67 y.o. male who was admitted 02/28/2023 for operative treatment ofUnilateral primary osteoarthritis, right knee. Patient has severe unremitting pain that affects sleep, daily activities, and work/hobbies. After pre-op clearance the patient was taken to the operating room on 02/28/2023 and underwent  Procedure(s): RIGHT TOTAL KNEE ARTHROPLASTY.    Patient was given perioperative antibiotics:  Anti-infectives (From admission, onward)    Start     Dose/Rate Route Frequency Ordered Stop   02/28/23 1600  ceFAZolin (ANCEF) IVPB 1 g/50 mL premix        1 g 100 mL/hr over 30 Minutes Intravenous Every 6 hours 02/28/23 1335 03/01/23 0047   02/28/23 0815  ceFAZolin (ANCEF) IVPB 2g/100 mL premix        2 g 200 mL/hr over 30 Minutes Intravenous On call to O.R. 02/28/23 0803 02/28/23 0957        Patient was given sequential compression devices, early ambulation, and chemoprophylaxis to prevent DVT.  Patient benefited maximally from hospital stay and there were no complications.    Recent vital signs: Patient Vitals for the past 24 hrs:  BP Temp Temp src Pulse Resp SpO2  03/01/23 0740 130/82 98.5 F (36.9 C) Oral 87 16 97 %  03/01/23 0600 132/82 98 F (36.7 C) Oral 91 20 98 %  03/01/23 0013 127/77 98.2 F (36.8 C) Oral 87 16 98 %   02/28/23 2008 (!) 140/86 98.1 F (36.7 C) Oral 78 16 98 %     Recent laboratory studies:  Recent Labs    03/01/23 0920  WBC 11.3*  HGB 11.6*  HCT 33.7*  PLT 154  NA 132*  K 3.7  CL 99  CO2 24  BUN 17  CREATININE 1.15  GLUCOSE 129*  CALCIUM 9.1     Discharge Medications:   Allergies as of 03/01/2023   No Known Allergies      Medication List     TAKE these medications    allopurinol 100 MG tablet Commonly known as: ZYLOPRIM Take 100 mg by mouth daily.   aspirin 81 MG chewable tablet Chew 1 tablet (81 mg total) by mouth 2 (two) times daily.   atorvastatin 10 MG tablet Commonly known as: LIPITOR Take 10 mg by mouth daily.   indomethacin 25 MG capsule Commonly known as: INDOCIN Take 25 mg by mouth 2 (two) times daily as needed for moderate pain (gout flares).   losartan-hydrochlorothiazide 100-25 MG tablet Commonly known as: HYZAAR Take 1 tablet by mouth daily.   methocarbamol 500 MG tablet Commonly known as: ROBAXIN Take 1 tablet (500 mg total) by mouth every 6 (six) hours as needed for muscle spasms.   oxyCODONE 5 MG immediate release tablet Commonly known as: Oxy IR/ROXICODONE Take 1-2 tablets (5-10 mg total) by mouth every 6 (six) hours as needed for moderate pain (pain score  4-6).   pantoprazole 40 MG tablet Commonly known as: PROTONIX Take 40 mg by mouth daily.   tadalafil 20 MG tablet Commonly known as: CIALIS Take 20 mg by mouth daily as needed for erectile dysfunction.               Durable Medical Equipment  (From admission, onward)           Start     Ordered   03/01/23 1533  For home use only DME 3 n 1  Once        03/01/23 1532   03/01/23 1533  For home use only DME Walker rolling  Once       Question Answer Comment  Walker: With 5 Inch Wheels   Patient needs a walker to treat with the following condition Status post knee surgery      03/01/23 1532   02/28/23 1336  DME 3 n 1  Once        02/28/23 1335   02/28/23  1336  DME Walker rolling  Once       Question Answer Comment  Walker: With 5 Inch Wheels   Patient needs a walker to treat with the following condition Status post total right knee replacement      02/28/23 1335            Diagnostic Studies: DG Knee Right Port  Result Date: 02/28/2023 CLINICAL DATA:  Status post right knee replacement. EXAM: PORTABLE RIGHT KNEE - 1-2 VIEW COMPARISON:  None Available. FINDINGS: Right knee arthroplasty in expected alignment. Long tibial stem. No periprosthetic lucency or fracture. There has been patellar resurfacing. Recent postsurgical change includes air and edema in the soft tissues and joint space. Anterior skin staples in place. IMPRESSION: Right knee arthroplasty without immediate postoperative complication. Electronically Signed   By: Narda Rutherford M.D.   On: 02/28/2023 13:23    Disposition: Discharge disposition: 01-Home or Self Care          Follow-up Information     [facility] Follow up.   Specialty: Home Health Services Why: The home health agency will contact you for the first home visit Contact information: [address] [place] [address] [phone]         Kathryne Hitch, MD Follow up in 2 week(s).   Specialty: Orthopedic Surgery Contact information: [address] [place] [address] [phone]                  Signed: Kathryne Hitch 03/01/2023, 3:44 PM

## 2023-03-01 NOTE — Progress Notes (Signed)
Physical Therapy Treatment Patient Details Name: Darrell Freeman MRN: [id number] DOB: 12-Sep-1955 Today's Date: 03/01/2023   History of Present Illness Pt is 67 year old presented to [facility] on  02/28/23 for Rt TKR. PMH - HTN, Gout, GI bleed    PT Comments  Pt received up in bathroom, pt agreeable to therapy session after premedication and reports pain and other symptoms somewhat improved this session. Pt mobilizing much more confidently but still needs dense safety cues for stand>sit transfer safety and step sequencing, spouse present and able to demo back safe guarding positions with gait belt. Pt needing mostly Supervision for gait with RW and CGA to minA for stair ascent/descend x3 with no rails using RW for support. Pt continues to benefit from PT services to progress toward functional mobility goals, anticipate he is safe for DC home from a functional mobility standpoint once medically cleared.    If plan is discharge home, recommend the following: A little help with walking and/or transfers;A little help with bathing/dressing/bathroom;Help with stairs or ramp for entrance;Assistance with cooking/housework   Can travel by private Automotive engineer (2 wheels);BSC/3in1    Recommendations for Other Services       Precautions / Restrictions Precautions Precautions: Knee Precaution Booklet Issued: Yes (comment) Knee Immobilizer - Right:  (not needed, pt performs SLR with <10 deg quad lag today) Restrictions Weight Bearing Restrictions: Yes RLE Weight Bearing: Weight bearing as tolerated     Mobility  Bed Mobility Overal bed mobility: Needs Assistance Bed Mobility: Supine to Sit, Sit to Supine     Supine to sit: Contact guard, Used rails Sit to supine: Contact guard assist, HOB elevated, Used rails   General bed mobility comments: pt received up in room    Transfers Overall transfer level: Needs assistance Equipment used: Rolling walker  (2 wheels) Transfers: Sit to/from Stand Sit to Stand: Min assist, Contact guard assist           General transfer comment: to chair cues for safe UE placement and RLE slightly advanced, pt still tending to kick LLE out as he sits causing trunk instability/posterior LOB (hips in chair but leaning trunk back suddenly into seat/over L arm rest), minA for trunk safety/stability. Pt given additional verbal/visual demo for safe technique to pt/spouse for BLE placement and BUE placement to improve safety, pt/spouse receptive.    Ambulation/Gait Ambulation/Gait assistance: Supervision Gait Distance (Feet): 150 Feet Assistive device: Rolling walker (2 wheels) Gait Pattern/deviations: Step-through pattern, Decreased stance time - right, Decreased step length - left, Antalgic, Knee flexed in stance - right, Decreased stride length, Step-to pattern       General Gait Details: mostly Supervision, no dizziness reported or nausea, fair cadence, mildly antalgic with mod BUE reliance through RW handles. Discussed ideas for reducing strain on BUE/hands and wrists, pt plans to use tennis grip tape to reduce fatigue through hands over RW handles. Pt new RW adjusted to proper height and discussed adjustment technique with pt/spouse if needed and RW warranty through insurance 5 years so not to lend out AD. Pt also has gait belt in room to take home.   Stairs Stairs: Yes Stairs assistance: Min assist, Contact guard assist Stair Management: Step to pattern, Backwards, With walker, No rails Number of Stairs: 3 General stair comments: 3 steps backward ascending/forward descending with RW on step for BUE support, minA to CGA for assist/mod cues for sequencing, spouse present and able to stand-by  for assist and perform guarding with gait belt and stabilizing RW on step for his safety. Dense verbal cues for sequence and pt/spouse reminded that it is also written on his HEP handout page 2 if they forget.      Balance Overall balance assessment: Needs assistance (Good seated, Fair standing balance, unsupported, reliant on RW for gait due to pain)                                          Cognition Arousal: Alert Behavior During Therapy: WFL for tasks assessed/performed Overall Cognitive Status: Within Functional Limits for tasks assessed           General Comments: Mildly anxious regarding DC plan for home today but agreeable and improved processing speed, initiation and tolerance for mobility in PM session.        Exercises Total Joint Exercises Verbal instruction for completion again after return home/prior to bed, pt performed extensively in AM session and defers questions as he has handout to reinforce.  General Comments General comments (skin integrity, edema, etc.): HR/SpO2 WFL on RA      Pertinent Vitals/Pain Pain Assessment Pain Assessment: 0-10 Pain Score: 7  Faces Pain Scale: Hurts whole lot Pain Location: R knee (posterior/lateral) Pain Descriptors / Indicators: Grimacing, Guarding, Sore, Sharp Pain Intervention(s): Limited activity within patient's tolerance, Premedicated before session, Monitored during session, Repositioned, Ice applied     PT Goals (current goals can now be found in the care plan section) Acute Rehab PT Goals Patient Stated Goal: return home PT Goal Formulation: With patient Time For Goal Achievement: 03/14/23 Progress towards PT goals: Progressing toward goals    Frequency    7X/week      PT Plan         AM-PAC PT "6 Clicks" Mobility   Outcome Measure  Help needed turning from your back to your side while in a flat bed without using bedrails?: None Help needed moving from lying on your back to sitting on the side of a flat bed without using bedrails?: A Little Help needed moving to and from a bed to a chair (including a wheelchair)?: A Little Help needed standing up from a chair using your arms (e.g., wheelchair or  bedside chair)?: A Lot (mod safety/technique cues) Help needed to walk in hospital room?: A Little Help needed climbing 3-5 steps with a railing? : A Lot (mod cues) 6 Click Score: 17    End of Session Equipment Utilized During Treatment: Gait belt Activity Tolerance: Patient tolerated treatment well Patient left: with call bell/phone within reach;Other (comment);in chair;with nursing/sitter in room;with family/visitor present (ice to R lateral knee per pt request, RN entering room to bring DME and review discharge paperwork) Nurse Communication: Mobility status PT Visit Diagnosis: Other abnormalities of gait and mobility (R26.89);Pain Pain - Right/Left: Right Pain - part of body: Knee     Time: 1610-9604 PT Time Calculation (min) (ACUTE ONLY): 23 min  Charges:    $Gait Training: 8-22 mins $Therapeutic Activity: 8-22 mins PT General Charges $$ ACUTE PT VISIT: 1 Visit                     Kaleth Koy P., PTA Acute Rehabilitation Services Secure Chat Preferred 9a-5:30pm Office: [phone]    Dorathy Kinsman [facility] 03/01/2023, 2:49 PM

## 2023-03-01 NOTE — Plan of Care (Signed)
Pt doing well. Pt given D/C instructions with verbal understanding. Rx's were sent to the pharmacy by MD. Pt's incision is clean and dry with no sign of infection. Pt's IV was removed prior to D/C. Pt received RW and 3-n-1 from [employer] per MD order. Home Health was arranged per order. Pt D/C'd home via wheelchair per MD order. Pt is stable @ D/C and has no other needs at this time. Rema Fendt, RN

## 2023-03-01 NOTE — Discharge Instructions (Signed)

## 2023-03-01 NOTE — Progress Notes (Signed)
Physical Therapy Treatment Patient Details Name: Darrell Freeman MRN: [id number] DOB: 06-19-1955 Today's Date: 03/01/2023   History of Present Illness Pt is 67 year old presented to [facility] on  02/28/23 for Rt TKR. PMH - HTN, Gout, GI bleed    PT Comments  Pt received in supine, agreeable to therapy session, pt recently premedicated and reluctant to participate due to c/o 10/10 pain but participates well and making good progress this date with ROM. Pt c/o increased dizziness/nausea in standing but BP appears fairly stable and HR/SpO2 WFL on RA. Pt needing up to minA and increased time to initiate and perform all tasks due to c/o severe pain and guarding. Pt reluctant to DC home after AM session due to symptoms of lightheadedness although R quad strength much improved and pt not needing KI. Anticipate pt will be cleared to DC home from a functional standpoint in 1-2 more sessions if symptoms of pain and lightheadedness improve; from a functional standpoint he should be safe to DC home with assist from spouse once DME arrives but we plan to see him in PM per pt request. Pt continues to benefit from PT services to progress toward functional mobility goals.    If plan is discharge home, recommend the following: A little help with walking and/or transfers;A little help with bathing/dressing/bathroom;Help with stairs or ramp for entrance;Assistance with cooking/housework   Can travel by private Automotive engineer (2 wheels);BSC/3in1    Recommendations for Other Services       Precautions / Restrictions Precautions Precautions: Knee Precaution Booklet Issued: Yes (comment) Knee Immobilizer - Right:  (not needed, pt performs SLR with <10 deg quad lag today) Restrictions Weight Bearing Restrictions: Yes RLE Weight Bearing: Weight bearing as tolerated     Mobility  Bed Mobility Overal bed mobility: Needs Assistance Bed Mobility: Supine to Sit, Sit to  Supine     Supine to sit: Contact guard, Used rails Sit to supine: Contact guard assist, HOB elevated, Used rails   General bed mobility comments: Cues for self-assist with hooking LLE under R ankle to assist with lifting/lowering, pt needs reminder of technique with return to supine. heavy reliance on hospital bed rails    Transfers Overall transfer level: Needs assistance Equipment used: Rolling walker (2 wheels) Transfers: Sit to/from Stand Sit to Stand: From elevated surface, Min assist, Contact guard assist           General transfer comment: from elevated bed height>RW with minA then CGA second trial and safety cues for UE placement and RLE placement for reduced pain. Mildly unsteady with stand to sit needs reminder to keep LLE on floor closer to bed to reduce risk of backward LOB/sliding off bed as he extends both legs out when sitting.    Ambulation/Gait Ambulation/Gait assistance: Contact guard assist Gait Distance (Feet): 85 Feet Assistive device: Rolling walker (2 wheels) Gait Pattern/deviations: Step-through pattern, Decreased stance time - right, Decreased step length - left, Antalgic, Knee flexed in stance - right, Decreased stride length, Step-to pattern       General Gait Details: CGA mostly, min cues for step-to vs step-through pattern initially pt with step-to, progressing to step-through pattern as pain reduced from 10/10 to 9/10 per subjective report. Distance limited due to pt reporting continued nausea/dizziness.   Stairs Stairs: Yes Stairs assistance: Min assist Stair Management: Step to pattern, Backwards, With walker, No rails Number of Stairs: 2 General stair comments: 1 step  up/down then 2 steps up/down backward ascending/forward descending with RW on step for BUE support, minA for assist/mod cues for sequencing and pt c/o nausea/dizziness after 2 steps so returned to room per pt request. Pt has 3 steps to enter home without rail.   Wheelchair  Mobility     Tilt Bed    Modified Rankin (Stroke Patients Only)       Balance Overall balance assessment: Mild deficits observed, not formally tested                                          Cognition Arousal: Alert Behavior During Therapy: WFL for tasks assessed/performed Overall Cognitive Status: Within Functional Limits for tasks assessed                                          Exercises Total Joint Exercises Ankle Circles/Pumps: AROM, Both, 20 reps, Supine Quad Sets: AROM, Both, 10 reps, Supine Towel Squeeze: AROM, Both, 5 reps, Supine Short Arc Quad: AAROM, Right, 10 reps, Supine Heel Slides: AAROM, Right, 10 reps, Supine Hip ABduction/ADduction: AROM, AAROM, Right, 10 reps, Supine Straight Leg Raises: AROM, AAROM, Right, 10 reps, Supine (AROM ~2 reps, AA other reps due to pain) Long Arc Quad: AAROM, Right, 5 reps, Seated Knee Flexion: AAROM, 5 reps, Right, Seated, AROM (LLE overpressure on some reps) Goniometric ROM: R knee flexion ROM 15 deg to 60 deg in supine    General Comments General comments (skin integrity, edema, etc.): Pt received pain meds ~5 mins prior to session, reports they are starting to help with pain by end of session but also that lightheadedness increased. See comments below for BP, HR/SpO2 [facility] throughout   03/01/23 1010  Vital Signs  Patient Position (if appropriate) Orthostatic Vitals  Orthostatic Lying   BP- Lying 145/86  Pulse- Lying 87  Orthostatic Sitting  BP- Sitting 132/84  Pulse- Sitting 86  Orthostatic Standing at 0 minutes  BP- Standing at 0 minutes 129/78       Pertinent Vitals/Pain Pain Assessment Pain Assessment: 0-10 Pain Score: 10-Worst pain ever Faces Pain Scale: Hurts whole lot Pain Location: R knee (posterior/lateral) Pain Descriptors / Indicators: Grimacing, Guarding, Moaning, Sore, Sharp, Other (Comment) (nausea) Pain Intervention(s): Limited activity within patient's  tolerance, Monitored during session, Premedicated before session, Repositioned, Patient requesting pain meds-RN notified, Ice applied           PT Goals (current goals can now be found in the care plan section) Acute Rehab PT Goals Patient Stated Goal: return home PT Goal Formulation: With patient Time For Goal Achievement: 03/14/23 Progress towards PT goals: Progressing toward goals    Frequency    7X/week      PT Plan         AM-PAC PT "6 Clicks" Mobility   Outcome Measure  Help needed turning from your back to your side while in a flat bed without using bedrails?: None Help needed moving from lying on your back to sitting on the side of a flat bed without using bedrails?: A Little Help needed moving to and from a bed to a chair (including a wheelchair)?: A Little Help needed standing up from a chair using your arms (e.g., wheelchair or bedside chair)?: A Lot (mod safety/technique cues) Help needed to walk  in hospital room?: A Little Help needed climbing 3-5 steps with a railing? : A Lot (mod cues) 6 Click Score: 17    End of Session Equipment Utilized During Treatment: Gait belt Activity Tolerance: Patient limited by pain;Treatment limited secondary to medical complications (Comment);Other (comment);Patient tolerated treatment well (c/o lightheadedness, nausea with standing activity) Patient left: with call bell/phone within reach;in bed;with bed alarm set;Other (comment) (ice to R lateral knee per pt request, pt defers OOB to chair due to fatigue/nausea) Nurse Communication: Mobility status;Other (comment) (pt requesting a second session in PM and more pain meds if possible) PT Visit Diagnosis: Other abnormalities of gait and mobility (R26.89);Pain Pain - Right/Left: Right Pain - part of body: Knee     Time: 1610-9604 PT Time Calculation (min) (ACUTE ONLY): 58 min  Charges:    $Gait Training: 8-22 mins $Therapeutic Exercise: 23-37 mins $Therapeutic Activity:  8-22 mins PT General Charges $$ ACUTE PT VISIT: 1 Visit                     Carrol Hougland P., PTA Acute Rehabilitation Services Secure Chat Preferred 9a-5:30pm Office: [phone]    Dorathy Kinsman [facility] 03/01/2023, 11:12 AM

## 2023-03-01 NOTE — Progress Notes (Signed)
Subjective: 1 Day Post-Op Procedure(s) (LRB): RIGHT TOTAL KNEE ARTHROPLASTY (Right) Patient reports pain as moderate.    Objective: Vital signs in last 24 hours: Temp:  [97.7 F (36.5 C)-98.5 F (36.9 C)] 98.5 F (36.9 C) (09/25 0740) Pulse Rate:  [68-91] 87 (09/25 0740) Resp:  [10-20] 16 (09/25 0740) BP: (111-153)/(77-94) 130/82 (09/25 0740) SpO2:  [95 %-99 %] 97 % (09/25 0740) Weight:  [79.4 kg] 79.4 kg (09/24 0807)  Intake/Output from previous day: 09/24 0701 - 09/25 0700 In: 2251.2 [P.O.:1200; I.V.:1000; IV Piggyback:51.2] Out: 1600 [Urine:1500; Blood:100] Intake/Output this shift: No intake/output data recorded.  No results for input(s): "HGB" in the last 72 hours. No results for input(s): "WBC", "RBC", "HCT", "PLT" in the last 72 hours. No results for input(s): "NA", "K", "CL", "CO2", "BUN", "CREATININE", "GLUCOSE", "CALCIUM" in the last 72 hours. No results for input(s): "LABPT", "INR" in the last 72 hours.  Sensation intact distally Intact pulses distally Dorsiflexion/Plantar flexion intact Incision: dressing C/D/I Compartment soft   Assessment/Plan: 1 Day Post-Op Procedure(s) (LRB): RIGHT TOTAL KNEE ARTHROPLASTY (Right) Up with therapy Discharge home with home health this afternoon if clears PT.      Kathryne Hitch 03/01/2023, 7:50 AM

## 2023-03-02 ENCOUNTER — Telehealth: Payer: Self-pay | Admitting: *Deleted

## 2023-03-02 ENCOUNTER — Other Ambulatory Visit: Payer: Self-pay | Admitting: Orthopaedic Surgery

## 2023-03-02 MED ORDER — TIZANIDINE HCL 4 MG PO TABS: 4.0000 mg | ORAL_TABLET | Freq: Four times a day (QID) | ORAL | 1 refills | Status: AC | PRN

## 2023-03-02 MED ORDER — GABAPENTIN 100 MG PO CAPS: 100.0000 mg | ORAL_CAPSULE | Freq: Three times a day (TID) | ORAL | 0 refills | Status: DC

## 2023-03-02 NOTE — Telephone Encounter (Signed)
Call back from patient and states pain is a little more under control, but last night was very rough. Will contiue to check with him regarding status.

## 2023-03-02 NOTE — Telephone Encounter (Signed)
Patient aware of the below message from [facility]

## 2023-03-02 NOTE — Telephone Encounter (Signed)
Attempted D/C call to patient; no answer and no VM set up on cell number. Called home number and reached his son. Patient is sleeping and did not want to disturb him. Will call back later today or tomorrow.

## 2023-03-02 NOTE — Telephone Encounter (Signed)
Pt wife calling stating pt had surgery on tues and came home yesterday and pt has not been able to sleep and is in severe pain. Wants to know if can get a stronger pain med for him. Please advise.

## 2023-03-03 ENCOUNTER — Telehealth: Payer: Self-pay

## 2023-03-03 DIAGNOSIS — I10 Essential (primary) hypertension: Secondary | ICD-10-CM | POA: Diagnosis not present

## 2023-03-03 DIAGNOSIS — F419 Anxiety disorder, unspecified: Secondary | ICD-10-CM | POA: Diagnosis not present

## 2023-03-03 DIAGNOSIS — Z7982 Long term (current) use of aspirin: Secondary | ICD-10-CM | POA: Diagnosis not present

## 2023-03-03 DIAGNOSIS — Z79891 Long term (current) use of opiate analgesic: Secondary | ICD-10-CM | POA: Diagnosis not present

## 2023-03-03 DIAGNOSIS — Z87891 Personal history of nicotine dependence: Secondary | ICD-10-CM | POA: Diagnosis not present

## 2023-03-03 DIAGNOSIS — K5790 Diverticulosis of intestine, part unspecified, without perforation or abscess without bleeding: Secondary | ICD-10-CM | POA: Diagnosis not present

## 2023-03-03 DIAGNOSIS — M109 Gout, unspecified: Secondary | ICD-10-CM | POA: Diagnosis not present

## 2023-03-03 DIAGNOSIS — K219 Gastro-esophageal reflux disease without esophagitis: Secondary | ICD-10-CM | POA: Diagnosis not present

## 2023-03-03 DIAGNOSIS — Z96651 Presence of right artificial knee joint: Secondary | ICD-10-CM | POA: Diagnosis not present

## 2023-03-03 DIAGNOSIS — Z471 Aftercare following joint replacement surgery: Secondary | ICD-10-CM | POA: Diagnosis not present

## 2023-03-03 NOTE — Telephone Encounter (Signed)
Home Health nurse with [facility] called concerning medications for patient.

## 2023-03-07 ENCOUNTER — Telehealth: Payer: Self-pay | Admitting: Orthopaedic Surgery

## 2023-03-07 ENCOUNTER — Ambulatory Visit: Payer: PPO | Admitting: Physical Therapy

## 2023-03-07 ENCOUNTER — Other Ambulatory Visit: Payer: Self-pay | Admitting: Orthopaedic Surgery

## 2023-03-07 MED ORDER — OXYCODONE HCL 5 MG PO TABS: 5.0000 mg | ORAL_TABLET | Freq: Four times a day (QID) | ORAL | 0 refills | Status: DC | PRN

## 2023-03-07 NOTE — Telephone Encounter (Signed)
Patient called needing authorization for an prescription. CB#[phone]

## 2023-03-07 NOTE — Telephone Encounter (Signed)
[employer] forms received. To [employer].

## 2023-03-09 ENCOUNTER — Ambulatory Visit: Payer: PPO | Admitting: Physical Therapy

## 2023-03-10 ENCOUNTER — Telehealth: Payer: Self-pay | Admitting: *Deleted

## 2023-03-10 NOTE — Telephone Encounter (Signed)
Ortho bundle call to patient.

## 2023-03-13 ENCOUNTER — Encounter: Payer: Self-pay | Admitting: Orthopaedic Surgery

## 2023-03-13 ENCOUNTER — Ambulatory Visit (INDEPENDENT_AMBULATORY_CARE_PROVIDER_SITE_OTHER): Payer: PPO | Admitting: Orthopaedic Surgery

## 2023-03-13 ENCOUNTER — Encounter: Payer: PPO | Admitting: Physical Therapy

## 2023-03-13 DIAGNOSIS — Z96651 Presence of right artificial knee joint: Secondary | ICD-10-CM

## 2023-03-13 NOTE — Progress Notes (Signed)
The patient is here today for his first postoperative visit status post a right total knee replacement.  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  He is doing well.  He is only taking medications intermittently.  He has been set up for physical therapy as an outpatient which I believe starts tomorrow.  On exam his incision looks good.  The staples can be removed and Steri-Strips applied.  His calf is soft.  He has been in his foot back-and-forth.  He lacks full extension by just a few degrees and active flexion to about 80 degrees.  At this point we will see him back in 4 weeks after course of outpatient physical therapy for repeat exam.  I would actually like a standing AP and lateral of his right knee at that visit given that his surgery was a little more extensive.

## 2023-03-15 ENCOUNTER — Encounter: Payer: PPO | Admitting: Physical Therapy

## 2023-03-15 ENCOUNTER — Ambulatory Visit: Payer: PPO | Attending: Orthopaedic Surgery | Admitting: Physical Therapy

## 2023-03-15 DIAGNOSIS — R262 Difficulty in walking, not elsewhere classified: Secondary | ICD-10-CM | POA: Insufficient documentation

## 2023-03-15 DIAGNOSIS — M1711 Unilateral primary osteoarthritis, right knee: Secondary | ICD-10-CM | POA: Insufficient documentation

## 2023-03-15 DIAGNOSIS — M25561 Pain in right knee: Secondary | ICD-10-CM | POA: Diagnosis not present

## 2023-03-15 NOTE — Therapy (Unsigned)
OUTPATIENT PHYSICAL THERAPY LOWER EXTREMITY EVALUATION   Patient Name: Darrell Freeman MRN: [id number] DOB:September 01, 1955, 67 y.o., male Today's Date: 03/15/2023  END OF SESSION:  PT End of Session - 03/15/23 1517     Visit Number 1    Number of Visits 20    Date for PT Re-Evaluation 05/24/23    Authorization Type HTA 2024    Authorization - Visit Number 1    Authorization - Number of Visits 20    Progress Note Due on Visit 10    PT Start Time 1430    PT Stop Time 1515    PT Time Calculation (min) 45 min    Activity Tolerance Patient tolerated treatment well    Behavior During Therapy WFL for tasks assessed/performed             Past Medical History:  Diagnosis Date   Anxiety    Arthritis    Diverticulosis    GERD (gastroesophageal reflux disease)    GI bleed    Hypertension    Osteoarthritis of right knee    Past Surgical History:  Procedure Laterality Date   COLONOSCOPY WITH PROPOFOL N/A 03/16/2017   Procedure: COLONOSCOPY WITH PROPOFOL;  Surgeon: Wyline Mood, MD;  Location: [facility] ENDOSCOPY;  Service: Gastroenterology;  Laterality: N/A;   ESOPHAGOGASTRODUODENOSCOPY (EGD) WITH PROPOFOL N/A 03/16/2017   Procedure: ESOPHAGOGASTRODUODENOSCOPY (EGD) WITH PROPOFOL;  Surgeon: Wyline Mood, MD;  Location: [facility] ENDOSCOPY;  Service: Gastroenterology;  Laterality: N/A;   OLECRANON BURSA EXCISION     OLECRANON BURSECTOMY Left 12/26/2014   Procedure: OLECRANON BURSA;  Surgeon: Myra Rude, MD;  Location: [facility] ORS;  Service: Orthopedics;  Laterality: Left;   TOTAL KNEE ARTHROPLASTY Right 02/28/2023   Procedure: RIGHT TOTAL KNEE ARTHROPLASTY;  Surgeon: Kathryne Hitch, MD;  Location: [facility] OR;  Service: Orthopedics;  Laterality: Right;   VISCERAL ANGIOGRAPHY N/A 10/17/2018   Procedure: VISCERAL ANGIOGRAPHY;  Surgeon: Annice Needy, MD;  Location: [facility] INVASIVE CV LAB;  Service: Cardiovascular;  Laterality: N/A;   Patient Active Problem List   Diagnosis Date Noted   Status  post total right knee replacement 02/28/2023   GIB (gastrointestinal bleeding) 10/17/2018   GI bleed 03/15/2017   HTN (hypertension) 03/15/2017   GERD (gastroesophageal reflux disease) 03/15/2017    PCP: Dr. Clydie Braun   REFERRING PROVIDER: Dr. Doneen Poisson   REFERRING DIAG: Right Total Knee Arthroplasty   THERAPY DIAG:  No diagnosis found.  Rationale for Evaluation and Treatment: Rehabilitation  ONSET DATE: 02/28/23  SUBJECTIVE:   SUBJECTIVE STATEMENT: See pertinent history   PERTINENT HISTORY: Pt reports that he had a relatively smooth surgery and that he continues to perform exercises that were given to him by HHPT. His pain is well controlled.  PAIN:  Are you having pain? Yes: NPRS scale: 1-2/10 Pain location: Right Anterior Knee  Pain description: Achy and Dull  Aggravating factors: Flexing knee  Relieving factors: Iciing and elevation and pain medications   PRECAUTIONS: None  RED FLAGS: None   WEIGHT BEARING RESTRICTIONS: No  FALLS:  Has patient fallen in last 6 months? No  LIVING ENVIRONMENT: Lives with: lives with their family and lives with their spouse Lives in: House/apartment Stairs: Yes: External: 2 steps; none Has following equipment at home: Dan Humphreys - 2 wheeled  OCCUPATION: Interior and spatial designer    PLOF: Independent  PATIENT GOALS: Wants to get back to his normal self especially exercising regularly.   NEXT MD VISIT: Nov 5th or 6th   OBJECTIVE:  Note: Objective  measures were completed at Evaluation unless otherwise noted.  VITALS: BP 118/74 HR 80 SpO2 100%  DIAGNOSTIC FINDINGS:  CLINICAL DATA:  Status post right knee replacement.   EXAM: PORTABLE RIGHT KNEE - 1-2 VIEW   COMPARISON:  None Available.   FINDINGS: Right knee arthroplasty in expected alignment. Long tibial stem. No periprosthetic lucency or fracture. There has been patellar resurfacing. Recent postsurgical change includes air and edema in the soft tissues and  joint space. Anterior skin staples in place.   IMPRESSION: Right knee arthroplasty without immediate postoperative complication.     Electronically Signed   By: Narda Rutherford M.D.   On: 02/28/2023 13:23  PATIENT SURVEYS:  FOTO 59 with target of 69   COGNITION: Overall cognitive status: Within functional limits for tasks assessed     SENSATION: WFL  EDEMA:  Circumferential:  R 18"/ L 15"  MUSCLE LENGTH: Hamstrings: Right 70 deg; Left 70 deg Thomas test: Not tested   POSTURE: No Significant postural limitations  PALPATION: Right anterior knee   LOWER EXTREMITY ROM:  Active/ Passive ROM Right eval Left eval  Hip flexion    Hip extension    Hip abduction    Hip adduction    Hip internal rotation    Hip external rotation    Knee flexion 70/70 120/120  Knee extension 5/5 2/2  Ankle dorsiflexion    Ankle plantarflexion    Ankle inversion    Ankle eversion     (Blank rows = not tested)  LOWER EXTREMITY MMT:  MMT Right eval Left eval  Hip flexion    Hip extension    Hip abduction    Hip adduction    Hip internal rotation    Hip external rotation    Knee flexion 4- 4+  Knee extension 4- 4+  Ankle dorsiflexion    Ankle plantarflexion    Ankle inversion    Ankle eversion     (Blank rows = not tested)   FUNCTIONAL TESTS:  {Functional tests:24029}  GAIT: Distance walked: *** Assistive device utilized: {Assistive devices:23999} Level of assistance: {Levels of assistance:24026} Comments: ***   TODAY'S TREATMENT:                                                                                                                              DATE: All single leg exercises performed on RLE    Knee Flexion AAROM 1 x 10  with 5 sec hold Short Arc Quads 2 x 10 with 2 sec hold  Straight Leg Raise 1 x 10  Gait Training with use of SPC  -min VC to decrease step length for safety purposes   PATIENT EDUCATION:  Education details: *** Person educated:  {Person educated:25204} Education method: {Education Method:25205} Education comprehension: {Education Comprehension:25206}  HOME EXERCISE PROGRAM: Access Code: [email or web address] URL: [email or web address] Date: 03/15/2023 Prepared by: Ellin Goodie  Exercises - Seated Knee Flexion AAROM  - 1 x daily - 2 sets - 10  reps - 5 sec  hold - Supine Active Straight Leg Raise  - 3-4 x weekly - 3 sets - 10 reps - Supine Short Arc Quad  - 1 x daily - 3 sets - 10 reps - 3 sec  hold  ASSESSMENT:  CLINICAL IMPRESSION: Patient is a *** y.o. *** who was seen today for physical therapy evaluation and treatment for ***.   OBJECTIVE IMPAIRMENTS: {opptimpairments:25111}.   ACTIVITY LIMITATIONS: carrying, lifting, standing, squatting, stairs, bathing, dressing, and locomotion level  PARTICIPATION LIMITATIONS: cleaning, laundry, driving, occupation, and yard work  PERSONAL FACTORS: {Personal factors:25162} are also affecting patient's functional outcome.   REHAB POTENTIAL: Excellent  CLINICAL DECISION MAKING: Stable/uncomplicated  EVALUATION COMPLEXITY: Low   GOALS: Goals reviewed with patient? {yes/no:20286}  SHORT TERM GOALS: Target date: *** *** Baseline: Goal status: INITIAL  2.  *** Baseline:  Goal status: INITIAL  3.  *** Baseline:  Goal status: INITIAL  4.  *** Baseline:  Goal status: INITIAL  5.  *** Baseline:  Goal status: INITIAL  6.  *** Baseline:  Goal status: INITIAL  LONG TERM GOALS: Target date: ***  *** Baseline:  Goal status: INITIAL  2.  *** Baseline:  Goal status: INITIAL  3.  *** Baseline:  Goal status: INITIAL  4.  *** Baseline:  Goal status: INITIAL  5.  *** Baseline:  Goal status: INITIAL  6.  *** Baseline:  Goal status: INITIAL   PLAN:  PT FREQUENCY: {rehab frequency:25116}  PT DURATION: {rehab duration:25117}  PLANNED INTERVENTIONS: {rehab planned interventions:25118::"Therapeutic exercises","Therapeutic  activity","Neuromuscular re-education","Balance training","Gait training","Patient/Family education","Self Care","Joint mobilization"}  PLAN FOR NEXT SESSION: ***   Johnn Hai, PT 03/15/2023, 3:18 PM

## 2023-03-20 ENCOUNTER — Telehealth: Payer: Self-pay | Admitting: Orthopaedic Surgery

## 2023-03-20 ENCOUNTER — Encounter: Payer: Self-pay | Admitting: Physical Therapy

## 2023-03-20 ENCOUNTER — Other Ambulatory Visit: Payer: Self-pay | Admitting: Orthopaedic Surgery

## 2023-03-20 ENCOUNTER — Ambulatory Visit: Payer: PPO | Admitting: Physical Therapy

## 2023-03-20 DIAGNOSIS — R262 Difficulty in walking, not elsewhere classified: Secondary | ICD-10-CM

## 2023-03-20 DIAGNOSIS — M25561 Pain in right knee: Secondary | ICD-10-CM

## 2023-03-20 MED ORDER — OXYCODONE HCL 5 MG PO TABS: 5.0000 mg | ORAL_TABLET | Freq: Four times a day (QID) | ORAL | 0 refills | Status: DC | PRN

## 2023-03-20 NOTE — Therapy (Signed)
OUTPATIENT PHYSICAL THERAPY LOWER EXTREMITY TREATMENT    Patient Name: Darrell Freeman MRN: [id number] DOB:[birth year], 67 y.o., male Today's Date: 03/20/2023  END OF SESSION:  PT End of Session - 03/20/23 1434     Visit Number 2    Number of Visits 20    Date for PT Re-Evaluation 05/24/23    Authorization Type HTA 2024    Authorization - Visit Number 2    Authorization - Number of Visits 20    Progress Note Due on Visit 10    PT Start Time 1434    PT Stop Time 1515    PT Time Calculation (min) 41 min    Activity Tolerance Patient tolerated treatment well    Behavior During Therapy WFL for tasks assessed/performed             Past Medical History:  Diagnosis Date   Anxiety    Arthritis    Diverticulosis    GERD (gastroesophageal reflux disease)    GI bleed    Hypertension    Osteoarthritis of right knee    Past Surgical History:  Procedure Laterality Date   COLONOSCOPY WITH PROPOFOL N/A 03/16/2017   Procedure: COLONOSCOPY WITH PROPOFOL;  Surgeon: Wyline Mood, MD;  Location: [facility] ENDOSCOPY;  Service: Gastroenterology;  Laterality: N/A;   ESOPHAGOGASTRODUODENOSCOPY (EGD) WITH PROPOFOL N/A 03/16/2017   Procedure: ESOPHAGOGASTRODUODENOSCOPY (EGD) WITH PROPOFOL;  Surgeon: Wyline Mood, MD;  Location: [facility] ENDOSCOPY;  Service: Gastroenterology;  Laterality: N/A;   OLECRANON BURSA EXCISION     OLECRANON BURSECTOMY Left 12/26/2014   Procedure: OLECRANON BURSA;  Surgeon: Myra Rude, MD;  Location: [facility] ORS;  Service: Orthopedics;  Laterality: Left;   TOTAL KNEE ARTHROPLASTY Right 02/28/2023   Procedure: RIGHT TOTAL KNEE ARTHROPLASTY;  Surgeon: Kathryne Hitch, MD;  Location: [facility] OR;  Service: Orthopedics;  Laterality: Right;   VISCERAL ANGIOGRAPHY N/A 10/17/2018   Procedure: VISCERAL ANGIOGRAPHY;  Surgeon: Annice Needy, MD;  Location: [facility] INVASIVE CV LAB;  Service: Cardiovascular;  Laterality: N/A;   Patient Active Problem List   Diagnosis Date Noted    Status post total right knee replacement 02/28/2023   GIB (gastrointestinal bleeding) 10/17/2018   GI bleed 03/15/2017   HTN (hypertension) 03/15/2017   GERD (gastroesophageal reflux disease) 03/15/2017    PCP: Dr. Clydie Braun   REFERRING PROVIDER: Dr. Doneen Poisson   REFERRING DIAG: Right Total Knee Arthroplasty   THERAPY DIAG:  Acute pain of right knee  Difficulty in walking, not elsewhere classified  Rationale for Evaluation and Treatment: Rehabilitation  ONSET DATE: 02/28/23  SUBJECTIVE:   SUBJECTIVE STATEMENT: Pt states that he feels that knee exercises are going well and that he is making progress little by little.   PERTINENT HISTORY: Pt reports that he had a relatively smooth surgery and that he continues to perform exercises that were given to him by HHPT. His pain is well controlled and he is now 2 weeks post op.  PAIN:  Are you having pain? Yes: NPRS scale: 1-2/10 Pain location: Right Anterior Knee  Pain description: Achy and Dull  Aggravating factors: Flexing knee  Relieving factors: Iciing and elevation and pain medications   PRECAUTIONS: None  RED FLAGS: None   WEIGHT BEARING RESTRICTIONS: No  FALLS:  Has patient fallen in last 6 months? No  LIVING ENVIRONMENT: Lives with: lives with their family and lives with their spouse Lives in: House/apartment Stairs: Yes: External: 2 steps; none Has following equipment at home: Dan Humphreys - 2 wheeled  OCCUPATION: Lab  Technologist    PLOF: Independent  PATIENT GOALS: Wants to get back to his normal self especially exercising regularly.   NEXT MD VISIT: Nov 5th or 6th   OBJECTIVE:  Note: Objective measures were completed at Evaluation unless otherwise noted.  VITALS: BP 118/74 HR 80 SpO2 100%  DIAGNOSTIC FINDINGS:  CLINICAL DATA:  Status post right knee replacement.   EXAM: PORTABLE RIGHT KNEE - 1-2 VIEW   COMPARISON:  None Available.   FINDINGS: Right knee arthroplasty in expected  alignment. Long tibial stem. No periprosthetic lucency or fracture. There has been patellar resurfacing. Recent postsurgical change includes air and edema in the soft tissues and joint space. Anterior skin staples in place.   IMPRESSION: Right knee arthroplasty without immediate postoperative complication.     Electronically Signed   By: Narda Rutherford M.D.   On: 02/28/2023 13:23  PATIENT SURVEYS:  FOTO 59 with target of 69   COGNITION: Overall cognitive status: Within functional limits for tasks assessed     SENSATION: WFL  EDEMA:  Circumferential:  R 18"/ L 15"  MUSCLE LENGTH: Hamstrings: Right 70 deg; Left 70 deg Thomas test: Not tested   POSTURE: No Significant postural limitations  PALPATION: Right anterior knee   LOWER EXTREMITY ROM:  Active/ Passive ROM Right eval Left eval  Hip flexion    Hip extension    Hip abduction    Hip adduction    Hip internal rotation    Hip external rotation    Knee flexion 70/70 120/120  Knee extension 5/5 2/2  Ankle dorsiflexion    Ankle plantarflexion    Ankle inversion    Ankle eversion     (Blank rows = not tested)  LOWER EXTREMITY MMT:  MMT Right eval Left eval  Hip flexion    Hip extension    Hip abduction    Hip adduction    Hip internal rotation    Hip external rotation    Knee flexion 4- 4+  Knee extension 4- 4+  Ankle dorsiflexion    Ankle plantarflexion    Ankle inversion    Ankle eversion     (Blank rows = not tested)   FUNCTIONAL TESTS:  None performed   GAIT: Distance walked: 50 ft  Assistive device utilized: None Level of assistance: Modified independence Comments: 2WW with decreased step length    TODAY'S TREATMENT:                                                                                                                              DATE: All single leg exercises performed on RLE    03/20/23: Nu-Step with seat and arms at 9 for 5 min  Prone Knee Hang on RLE for 5 min   Advanced Straight Leg Raise on RLE with yellow band 2 x 10  Advanced Straight Leg Raise on RLE with red band 1 x 10  Quad Set on RLE 5 sec isometric hold on  RLE 1 x 10  Standing Quad Set with ball behind knee against wall 5 sec hold 1 x 10  Long Sitting Calf Stretch 3 x 30 sec    Eval  Knee Flexion AAROM 1 x 10  with 5 sec hold Short Arc Quads 2 x 10 with 2 sec hold  Straight Leg Raise 1 x 10  Gait Training with use of SPC  -min VC to decrease step length for safety purposes   PATIENT EDUCATION:  Education details: Form and technique for correct performance of exercise  Person educated: Patient Education method: Explanation, Demonstration, Verbal cues, and Handouts Education comprehension: verbalized understanding, returned demonstration, and verbal cues required  HOME EXERCISE PROGRAM: Access Code: [email or web address] URL: [email or web address] Date: 03/20/2023 Prepared by: Ellin Goodie  Exercises - Long Sitting Calf Stretch with Strap  - 1 x daily - 3 reps - 30-60 sec sec  hold - Seated Knee Flexion AAROM  - 1 x daily - 2 sets - 10 reps - 5 sec  hold - Prone Knee Extension with Ankle Weight  - 1 x daily - 2-3 reps - 5 min  hold - Standing Quad Set  - 1 x daily - 2 sets - 10 reps - 5 sec  hold - Active Straight Leg Raise Advanced  - 3-4 x weekly - 3 sets - 10 reps ASSESSMENT:  CLINICAL IMPRESSION: Pt presents for initial treatment s/p 3 weeks right TKA. He continues to show right knee extensor lag, but this is well within timeline for rehab. He was able to complete all exercises without a significant increase in his right knee pain. He does show an improvement in his hip flexion strength with ability to perform straight leg raise with added resistance. He will benefit from skilled PT to address these aforementioned deficits to return to walking and exercising without pain or discomfort in his right knee and without having to rely on an AD.   OBJECTIVE IMPAIRMENTS: Abnormal  gait, decreased balance, difficulty walking, hypomobility, increased edema, impaired flexibility, and pain.   ACTIVITY LIMITATIONS: carrying, lifting, standing, squatting, stairs, bathing, dressing, and locomotion level  PARTICIPATION LIMITATIONS: cleaning, laundry, driving, occupation, and yard work  PERSONAL FACTORS: Age are also affecting patient's functional outcome.   REHAB POTENTIAL: Excellent  CLINICAL DECISION MAKING: Stable/uncomplicated  EVALUATION COMPLEXITY: Low   GOALS: Goals reviewed with patient? No  SHORT TERM GOALS: Target date: 03/30/2023  PT reviewed the following HEP with patient with patient able to demonstrate a set of the following with min cuing for correction needed. PT educated patient on parameters of therex (how/when to inc/decrease intensity, frequency, rep/set range, stretch hold time, and purpose of therex) with verbalized understanding.  Baseline: NT 03/20/23: Able to perform independently  Goal status: ACHIEVED    LONG TERM GOALS: Target date: 05/25/2023  Patient will have improved function and activity level as evidenced by an increase in FOTO score by 10 points or more.  Baseline: 59 with target of 69  Goal status: ONGOING   2.  Patient will demonstrate symmetrical hip and knee strength between RLE and LLE as evidence of improved right knee function.  Baseline: Knee Flex R/L 4-/4, Knee Ext R/L 4-/4 Goal status: ONGOING   3.  Patient will demonstrate symmetrical knee AROM between RLE and LLE as evidence of improved knee function. Baseline: Knee Flex R/L 70/120, Knee Ext R/L 5/2 Goal status: ONGOING   4.  Patient will demonstrate symmetrical knee girth between RLE and LLE for improved right knee  function and evidence of healing.  Baseline: Knee Circumference R/L 18"/15" Goal status: ONGOING     PLAN:  PT FREQUENCY: 1-2x/week  PT DURATION: 10 weeks  PLANNED INTERVENTIONS: 97146- PT Re-evaluation, 97110-Therapeutic exercises, 97140-  Manual therapy, 97116- Gait training, 09811- Aquatic Therapy, Patient/Family education, Balance training, Dry Needling, Joint mobilization, Joint manipulation, Scar mobilization, DME instructions, Cryotherapy, Moist heat, Therapeutic exercises, Therapeutic activity, Neuromuscular re-education, Gait training, and Self Care  PLAN FOR NEXT SESSION: Continue with Knee ROM and strengthening Exercises: Mini-Squat and Lunge on Step for added knee flexion.   Ellin Goodie PT, DPT  [facility] [address] [place], [address] Phone: [phone]   Fax:  [phone]

## 2023-03-20 NOTE — Telephone Encounter (Signed)
Patient called and needs a refill on the oxycodone. CB#[phone]

## 2023-03-23 ENCOUNTER — Encounter: Payer: Self-pay | Admitting: Physical Therapy

## 2023-03-23 ENCOUNTER — Ambulatory Visit: Payer: PPO | Admitting: Physical Therapy

## 2023-03-23 DIAGNOSIS — R262 Difficulty in walking, not elsewhere classified: Secondary | ICD-10-CM

## 2023-03-23 DIAGNOSIS — M25561 Pain in right knee: Secondary | ICD-10-CM | POA: Diagnosis not present

## 2023-03-23 NOTE — Therapy (Signed)
OUTPATIENT PHYSICAL THERAPY LOWER EXTREMITY TREATMENT    Patient Name: Darrell Freeman MRN: [id number] DOB:[birth year], 67 y.o., male Today's Date: 03/23/2023  END OF SESSION:  PT End of Session - 03/23/23 1322     Visit Number 3    Number of Visits 20    Date for PT Re-Evaluation 05/24/23    Authorization Type HTA [id number]    Authorization - Visit Number 3    Authorization - Number of Visits 20    Progress Note Due on Visit 10    PT Start Time 1325    PT Stop Time 1405    PT Time Calculation (min) 40 min    Activity Tolerance Patient tolerated treatment well    Behavior During Therapy WFL for tasks assessed/performed             Past Medical History:  Diagnosis Date   Anxiety    Arthritis    Diverticulosis    GERD (gastroesophageal reflux disease)    GI bleed    Hypertension    Osteoarthritis of right knee    Past Surgical History:  Procedure Laterality Date   COLONOSCOPY WITH PROPOFOL N/A 03/16/2017   Procedure: COLONOSCOPY WITH PROPOFOL;  Surgeon: Wyline Mood, MD;  Location: [facility] ENDOSCOPY;  Service: Gastroenterology;  Laterality: N/A;   ESOPHAGOGASTRODUODENOSCOPY (EGD) WITH PROPOFOL N/A 03/16/2017   Procedure: ESOPHAGOGASTRODUODENOSCOPY (EGD) WITH PROPOFOL;  Surgeon: Wyline Mood, MD;  Location: [facility] ENDOSCOPY;  Service: Gastroenterology;  Laterality: N/A;   OLECRANON BURSA EXCISION     OLECRANON BURSECTOMY Left 12/26/2014   Procedure: OLECRANON BURSA;  Surgeon: Myra Rude, MD;  Location: [facility] ORS;  Service: Orthopedics;  Laterality: Left;   TOTAL KNEE ARTHROPLASTY Right 02/28/2023   Procedure: RIGHT TOTAL KNEE ARTHROPLASTY;  Surgeon: Kathryne Hitch, MD;  Location: [facility] OR;  Service: Orthopedics;  Laterality: Right;   VISCERAL ANGIOGRAPHY N/A 10/17/2018   Procedure: VISCERAL ANGIOGRAPHY;  Surgeon: Annice Needy, MD;  Location: [facility] INVASIVE CV LAB;  Service: Cardiovascular;  Laterality: N/A;   Patient Active Problem List   Diagnosis Date Noted    Status post total right knee replacement 02/28/2023   GIB (gastrointestinal bleeding) 10/17/2018   GI bleed 03/15/2017   HTN (hypertension) 03/15/2017   GERD (gastroesophageal reflux disease) 03/15/2017    PCP: Dr. Clydie Braun   REFERRING PROVIDER: Dr. Doneen Poisson   REFERRING DIAG: Right Total Knee Arthroplasty   THERAPY DIAG:  Acute pain of right knee  Difficulty in walking, not elsewhere classified  Rationale for Evaluation and Treatment: Rehabilitation  ONSET DATE: 02/28/23  SUBJECTIVE:   SUBJECTIVE STATEMENT: Pt states that continues to feel that right knee is coming along.    PERTINENT HISTORY: Pt reports that he had a relatively smooth surgery and that he continues to perform exercises that were given to him by HHPT. His pain is well controlled and he is now 2 weeks post op.  PAIN:  Are you having pain? Yes: NPRS scale: 1-2/10 Pain location: Right Anterior Knee  Pain description: Achy and Dull  Aggravating factors: Flexing knee  Relieving factors: Iciing and elevation and pain medications   PRECAUTIONS: None  RED FLAGS: None   WEIGHT BEARING RESTRICTIONS: No  FALLS:  Has patient fallen in last 6 months? No  LIVING ENVIRONMENT: Lives with: lives with their family and lives with their spouse Lives in: House/apartment Stairs: Yes: External: 2 steps; none Has following equipment at home: Dan Humphreys - 2 wheeled  OCCUPATION: Interior and spatial designer    PLOF: Independent  PATIENT GOALS: Wants to get back to his normal self especially exercising regularly.   NEXT MD VISIT: Nov 5th or 6th   OBJECTIVE:  Note: Objective measures were completed at Evaluation unless otherwise noted.  VITALS: BP 118/74 HR 80 SpO2 100%  DIAGNOSTIC FINDINGS:  CLINICAL DATA:  Status post right knee replacement.   EXAM: PORTABLE RIGHT KNEE - 1-2 VIEW   COMPARISON:  None Available.   FINDINGS: Right knee arthroplasty in expected alignment. Long tibial stem.  No periprosthetic lucency or fracture. There has been patellar resurfacing. Recent postsurgical change includes air and edema in the soft tissues and joint space. Anterior skin staples in place.   IMPRESSION: Right knee arthroplasty without immediate postoperative complication.     Electronically Signed   By: Narda Rutherford M.D.   On: 02/28/2023 13:23  PATIENT SURVEYS:  FOTO 59 with target of 69   COGNITION: Overall cognitive status: Within functional limits for tasks assessed     SENSATION: WFL  EDEMA:  Circumferential:  R 18"/ L 15"  MUSCLE LENGTH: Hamstrings: Right 70 deg; Left 70 deg Thomas test: Not tested   POSTURE: No Significant postural limitations  PALPATION: Right anterior knee   LOWER EXTREMITY ROM:  Active/ Passive ROM Right eval Left eval  Hip flexion    Hip extension    Hip abduction    Hip adduction    Hip internal rotation    Hip external rotation    Knee flexion 70/70 120/120  Knee extension 5/5 2/2  Ankle dorsiflexion    Ankle plantarflexion    Ankle inversion    Ankle eversion     (Blank rows = not tested)  LOWER EXTREMITY MMT:  MMT Right eval Left eval  Hip flexion    Hip extension    Hip abduction    Hip adduction    Hip internal rotation    Hip external rotation    Knee flexion 4- 4+  Knee extension 4- 4+  Ankle dorsiflexion    Ankle plantarflexion    Ankle inversion    Ankle eversion     (Blank rows = not tested)   FUNCTIONAL TESTS:  None performed   GAIT: Distance walked: 50 ft  Assistive device utilized: None Level of assistance: Modified independence Comments: 2WW with decreased step length    TODAY'S TREATMENT:                                                                                                                              DATE: All single leg exercises performed on RLE    03/23/23: Nu-Step with seat at 9 for 5 min and resistance 1 Matrix Recumbent Bicycle seat at 17 with half rotations 3  min  10 meter walk x 6 without SPC with heel to toe step sequence.  10 meter walk over 3 obstacles  Standing Quad Set against wall with pillow behind R knee 5 sec hold 1 x 10  -Pt  reports increase in NRPS of 5/10  Seated Knee Hang with #5 lbs in backpack for 5 min    03/20/23: Nu-Step with seat and arms at 9 for 5 min  Prone Knee Hang on RLE for 5 min  Advanced Straight Leg Raise on RLE with yellow band 2 x 10  Advanced Straight Leg Raise on RLE with red band 1 x 10  Quad Set on RLE 5 sec isometric hold on RLE 1 x 10  Standing Quad Set with ball behind knee against wall 5 sec hold 1 x 10  Long Sitting Calf Stretch 3 x 30 sec    Eval  Knee Flexion AAROM 1 x 10  with 5 sec hold Short Arc Quads 2 x 10 with 2 sec hold  Straight Leg Raise 1 x 10  Gait Training with use of SPC  -min VC to decrease step length for safety purposes   PATIENT EDUCATION:  Education details: Form and technique for correct performance of exercise  Person educated: Patient Education method: Explanation, Demonstration, Verbal cues, and Handouts Education comprehension: verbalized understanding, returned demonstration, and verbal cues required  HOME EXERCISE PROGRAM: Access Code: [email or web address] URL: [email or web address] Date: 03/23/2023 Prepared by: Ellin Goodie  Exercises - Long Sitting Calf Stretch with Strap  - 1 x daily - 3 reps - 30-60 sec sec  hold - Seated Knee Flexion AAROM  - 1 x daily - 2 sets - 10 reps - 5 sec  hold - Standing Knee Flexion Stretch on Step  - 1 x daily - 10 reps - Prone Knee Extension with Ankle Weight  - 1 x daily - 2-3 reps - 5 min  hold - Active Straight Leg Raise Advanced  - 3-4 x weekly - 3 sets - 10 reps - Standing Quad Set  - 1 x daily - 2 sets - 10 reps - 5 sec  hold - Seated Hamstring Stretch with Chair  - 1 x daily - 3 reps - 5-10 minutes  hold   ASSESSMENT:  CLINICAL IMPRESSION: Pt presents for initial treatment s/p 3 weeks right TKA. Pt continues to  progress towards goals with progression of knee ROM and strength exercises. He was able to perform standing knee flexion without an increase in his pain. He also demonstrates improved mobility with ability to ambulate safely without the use of his single point cane.    OBJECTIVE IMPAIRMENTS: Abnormal gait, decreased balance, difficulty walking, hypomobility, increased edema, impaired flexibility, and pain.   ACTIVITY LIMITATIONS: carrying, lifting, standing, squatting, stairs, bathing, dressing, and locomotion level  PARTICIPATION LIMITATIONS: cleaning, laundry, driving, occupation, and yard work  PERSONAL FACTORS: Age are also affecting patient's functional outcome.   REHAB POTENTIAL: Excellent  CLINICAL DECISION MAKING: Stable/uncomplicated  EVALUATION COMPLEXITY: Low   GOALS: Goals reviewed with patient? No  SHORT TERM GOALS: Target date: 03/30/2023  PT reviewed the following HEP with patient with patient able to demonstrate a set of the following with min cuing for correction needed. PT educated patient on parameters of therex (how/when to inc/decrease intensity, frequency, rep/set range, stretch hold time, and purpose of therex) with verbalized understanding.  Baseline: NT 03/20/23: Able to perform independently  Goal status: ACHIEVED    LONG TERM GOALS: Target date: 05/25/2023  Patient will have improved function and activity level as evidenced by an increase in FOTO score by 10 points or more.  Baseline: 59 with target of 69  Goal status: ONGOING   2.  Patient will demonstrate symmetrical hip and knee  strength between RLE and LLE as evidence of improved right knee function.  Baseline: Knee Flex R/L 4-/4, Knee Ext R/L 4-/4 Goal status: ONGOING   3.  Patient will demonstrate symmetrical knee AROM between RLE and LLE as evidence of improved knee function. Baseline: Knee Flex R/L 70/120, Knee Ext R/L 5/2 Goal status: ONGOING   4.  Patient will demonstrate symmetrical knee  girth between RLE and LLE for improved right knee function and evidence of healing.  Baseline: Knee Circumference R/L 18"/15" Goal status: ONGOING     PLAN:  PT FREQUENCY: 1-2x/week  PT DURATION: 10 weeks  PLANNED INTERVENTIONS: 97146- PT Re-evaluation, 97110-Therapeutic exercises, 97140- Manual therapy, 97116- Gait training, 16109- Aquatic Therapy, Patient/Family education, Balance training, Dry Needling, Joint mobilization, Joint manipulation, Scar mobilization, DME instructions, Cryotherapy, Moist heat, Therapeutic exercises, Therapeutic activity, Neuromuscular re-education, Gait training, and Self Care  PLAN FOR NEXT SESSION: Continue with Knee ROM and strengthening Exercises: Mini-Squat and partial lunges and step ups  Ellin Goodie PT, DPT  [facility] [address] [place], [address] Phone: [phone]   Fax:  [phone]

## 2023-03-28 ENCOUNTER — Ambulatory Visit: Payer: PPO | Admitting: Physical Therapy

## 2023-03-30 ENCOUNTER — Ambulatory Visit: Payer: PPO | Admitting: Physical Therapy

## 2023-03-30 ENCOUNTER — Encounter: Payer: Self-pay | Admitting: Physical Therapy

## 2023-03-30 DIAGNOSIS — R262 Difficulty in walking, not elsewhere classified: Secondary | ICD-10-CM

## 2023-03-30 DIAGNOSIS — M25561 Pain in right knee: Secondary | ICD-10-CM | POA: Diagnosis not present

## 2023-03-30 NOTE — Therapy (Signed)
OUTPATIENT PHYSICAL THERAPY LOWER EXTREMITY TREATMENT    Patient Name: Darrell Freeman MRN: [id number] DOB:[birth year], 67 y.o., male Today's Date: 03/30/2023  END OF SESSION:  PT End of Session - 03/30/23 1338     Visit Number 4    Number of Visits 20    Date for PT Re-Evaluation 05/24/23    Authorization Type HTA 2024    Authorization - Visit Number 4    Authorization - Number of Visits 20    Progress Note Due on Visit 10    PT Start Time 1345    PT Stop Time 1430    PT Time Calculation (min) 45 min    Activity Tolerance Patient tolerated treatment well    Behavior During Therapy WFL for tasks assessed/performed             Past Medical History:  Diagnosis Date   Anxiety    Arthritis    Diverticulosis    GERD (gastroesophageal reflux disease)    GI bleed    Hypertension    Osteoarthritis of right knee    Past Surgical History:  Procedure Laterality Date   COLONOSCOPY WITH PROPOFOL N/A 03/16/2017   Procedure: COLONOSCOPY WITH PROPOFOL;  Surgeon: Wyline Mood, MD;  Location: [facility] ENDOSCOPY;  Service: Gastroenterology;  Laterality: N/A;   ESOPHAGOGASTRODUODENOSCOPY (EGD) WITH PROPOFOL N/A 03/16/2017   Procedure: ESOPHAGOGASTRODUODENOSCOPY (EGD) WITH PROPOFOL;  Surgeon: Wyline Mood, MD;  Location: [facility] ENDOSCOPY;  Service: Gastroenterology;  Laterality: N/A;   OLECRANON BURSA EXCISION     OLECRANON BURSECTOMY Left 12/26/2014   Procedure: OLECRANON BURSA;  Surgeon: Myra Rude, MD;  Location: [facility] ORS;  Service: Orthopedics;  Laterality: Left;   TOTAL KNEE ARTHROPLASTY Right 02/28/2023   Procedure: RIGHT TOTAL KNEE ARTHROPLASTY;  Surgeon: Kathryne Hitch, MD;  Location: [facility] OR;  Service: Orthopedics;  Laterality: Right;   VISCERAL ANGIOGRAPHY N/A 10/17/2018   Procedure: VISCERAL ANGIOGRAPHY;  Surgeon: Annice Needy, MD;  Location: [facility] INVASIVE CV LAB;  Service: Cardiovascular;  Laterality: N/A;   Patient Active Problem List   Diagnosis Date Noted    Status post total right knee replacement 02/28/2023   GIB (gastrointestinal bleeding) 10/17/2018   GI bleed 03/15/2017   HTN (hypertension) 03/15/2017   GERD (gastroesophageal reflux disease) 03/15/2017    PCP: Dr. Clydie Braun   REFERRING PROVIDER: Dr. Doneen Poisson   REFERRING DIAG: Right Total Knee Arthroplasty   THERAPY DIAG:  Acute pain of right knee  Difficulty in walking, not elsewhere classified  Rationale for Evaluation and Treatment: Rehabilitation  ONSET DATE: 02/28/23  SUBJECTIVE:   SUBJECTIVE STATEMENT: Pt reports that exercises are going well and that he continues to feel progress with his knee.    PERTINENT HISTORY: Pt reports that he had a relatively smooth surgery and that he continues to perform exercises that were given to him by HHPT. His pain is well controlled and he is now 4 weeks post op.  PAIN:  Are you having pain? Yes: NPRS scale: 1-2/10 Pain location: Right Anterior Knee  Pain description: Achy and Dull  Aggravating factors: Flexing knee  Relieving factors: Iciing and elevation and pain medications   PRECAUTIONS: None  RED FLAGS: None   WEIGHT BEARING RESTRICTIONS: No  FALLS:  Has patient fallen in last 6 months? No  LIVING ENVIRONMENT: Lives with: lives with their family and lives with their spouse Lives in: House/apartment Stairs: Yes: External: 2 steps; none Has following equipment at home: Dan Humphreys - 2 wheeled  OCCUPATION: Interior and spatial designer  PLOF: Independent  PATIENT GOALS: Wants to get back to his normal self especially exercising regularly.   NEXT MD VISIT: Nov 5th or 6th   OBJECTIVE:  Note: Objective measures were completed at Evaluation unless otherwise noted.  VITALS: BP 118/74 HR 80 SpO2 100%  DIAGNOSTIC FINDINGS:  CLINICAL DATA:  Status post right knee replacement.   EXAM: PORTABLE RIGHT KNEE - 1-2 VIEW   COMPARISON:  None Available.   FINDINGS: Right knee arthroplasty in expected alignment.  Long tibial stem. No periprosthetic lucency or fracture. There has been patellar resurfacing. Recent postsurgical change includes air and edema in the soft tissues and joint space. Anterior skin staples in place.   IMPRESSION: Right knee arthroplasty without immediate postoperative complication.     Electronically Signed   By: Narda Rutherford M.D.   On: 02/28/2023 13:23  PATIENT SURVEYS:  FOTO 59 with target of 69   COGNITION: Overall cognitive status: Within functional limits for tasks assessed     SENSATION: WFL  EDEMA:  Circumferential:  R 18"/ L 15"  MUSCLE LENGTH: Hamstrings: Right 70 deg; Left 70 deg Thomas test: Not tested   POSTURE: No Significant postural limitations  PALPATION: Right anterior knee   LOWER EXTREMITY ROM:  Active/ Passive ROM Right eval Left eval  Hip flexion    Hip extension    Hip abduction    Hip adduction    Hip internal rotation    Hip external rotation    Knee flexion 70/70 120/120  Knee extension 5/5 2/2  Ankle dorsiflexion    Ankle plantarflexion    Ankle inversion    Ankle eversion     (Blank rows = not tested)  LOWER EXTREMITY MMT:  MMT Right eval Left eval  Hip flexion    Hip extension    Hip abduction    Hip adduction    Hip internal rotation    Hip external rotation    Knee flexion 4- 4+  Knee extension 4- 4+  Ankle dorsiflexion    Ankle plantarflexion    Ankle inversion    Ankle eversion     (Blank rows = not tested)   FUNCTIONAL TESTS:  None performed   GAIT: Distance walked: 50 ft  Assistive device utilized: None Level of assistance: Modified independence Comments: 2WW with decreased step length    TODAY'S TREATMENT:                                                                                                                              DATE: All single leg exercises performed on RLE    03/30/23: Nu-Step with seat a 10 for 5 min and resistance 3  OMEGA Leg Press BLE #45 1 x 10  OMEGA  Leg Press BLE #75 1 x 10  OMEGA Leg Press RLE #35 1 x 10  OMEGA Leg Press RLE #45 2 x 10  TRX Squat with 1 x 10  TRX Squat with 1  x 10  -mod VC to increase hip flexion  -visual input with mirror  Partial Lunge with 1 UE 3 X 10  -min VC to decrease ankle external rotation  Sled Push and Pull 10 M x 6  Squat with Hip   03/23/23: Nu-Step with seat at 9 for 5 min and resistance 1 Matrix Recumbent Bicycle seat at 17 with half rotations 3 min  10 meter walk x 6 without SPC with heel to toe step sequence.  10 meter walk over 3 obstacles  Standing Quad Set against wall with pillow behind R knee 5 sec hold 1 x 10  -Pt reports increase in NRPS of 5/10  Seated Knee Hang with #5 lbs in backpack for 5 min   03/20/23: Nu-Step with seat and arms at 9 for 5 min  Prone Knee Hang on RLE for 5 min  Advanced Straight Leg Raise on RLE with yellow band 2 x 10  Advanced Straight Leg Raise on RLE with red band 1 x 10  Quad Set on RLE 5 sec isometric hold on RLE 1 x 10  Standing Quad Set with ball behind knee against wall 5 sec hold 1 x 10  Long Sitting Calf Stretch 3 x 30 sec    Eval  Knee Flexion AAROM 1 x 10  with 5 sec hold Short Arc Quads 2 x 10 with 2 sec hold  Straight Leg Raise 1 x 10  Gait Training with use of SPC  -min VC to decrease step length for safety purposes   PATIENT EDUCATION:  Education details: Form and technique for correct performance of exercise  Person educated: Patient Education method: Explanation, Demonstration, Verbal cues, and Handouts Education comprehension: verbalized understanding, returned demonstration, and verbal cues required  HOME EXERCISE PROGRAM: Access Code: [email or web address] URL: [email or web address] Date: 03/30/2023 Prepared by: Ellin Goodie  Exercises - Long Sitting Calf Stretch with Strap  - 2-3 x daily - 3 reps - 30-60 sec sec  hold - Gastroc Stretch on Step  - 2-3 x weekly - 3 reps - 60 sec  hold - Prone Knee Extension with Ankle  Weight  - 1 x daily - 2-3 reps - 5 min  hold - Seated Hamstring Stretch with Chair  - 1 x daily - 3 reps - 5-10 minutes  hold - Standing Partial Lunge  - 3-4 x weekly - 3 sets - 10 reps - Squat with Chair Touch and Resistance Loop  - 3-4 x weekly - 3 sets - 10 reps   ASSESSMENT:  CLINICAL IMPRESSION: Pt is s/p 4 weeks for right TKA. He shows improved right knee ROM and strength with ability to perform squats with increased dept and RLE strengthening with increased resistance. Pt required min VC for hip adjustment. He will continue to benefit from skilled PT to improve right knee ROM and strength to return to recreational exercise specifically cardio without pain or discomfort.     OBJECTIVE IMPAIRMENTS: Abnormal gait, decreased balance, difficulty walking, hypomobility, increased edema, impaired flexibility, and pain.   ACTIVITY LIMITATIONS: carrying, lifting, standing, squatting, stairs, bathing, dressing, and locomotion level  PARTICIPATION LIMITATIONS: cleaning, laundry, driving, occupation, and yard work  PERSONAL FACTORS: Age are also affecting patient's functional outcome.   REHAB POTENTIAL: Excellent  CLINICAL DECISION MAKING: Stable/uncomplicated  EVALUATION COMPLEXITY: Low   GOALS: Goals reviewed with patient? No  SHORT TERM GOALS: Target date: 03/30/2023  PT reviewed the following HEP with patient with patient able to demonstrate a set of the following  with min cuing for correction needed. PT educated patient on parameters of therex (how/when to inc/decrease intensity, frequency, rep/set range, stretch hold time, and purpose of therex) with verbalized understanding.  Baseline: NT 03/20/23: Able to perform independently  Goal status: ACHIEVED    LONG TERM GOALS: Target date: 05/25/2023  Patient will have improved function and activity level as evidenced by an increase in FOTO score by 10 points or more.  Baseline: 59 with target of 69  Goal status: ONGOING   2.   Patient will demonstrate symmetrical hip and knee strength between RLE and LLE as evidence of improved right knee function.  Baseline: Knee Flex R/L 4-/4, Knee Ext R/L 4-/4 Goal status: ONGOING   3.  Patient will demonstrate symmetrical knee AROM between RLE and LLE as evidence of improved knee function. Baseline: Knee Flex R/L 70/120, Knee Ext R/L 5/2 Goal status: ONGOING   4.  Patient will demonstrate symmetrical knee girth between RLE and LLE for improved right knee function and evidence of healing.  Baseline: Knee Circumference R/L 18"/15" Goal status: ONGOING     PLAN:  PT FREQUENCY: 1-2x/week  PT DURATION: 10 weeks  PLANNED INTERVENTIONS: 97146- PT Re-evaluation, 97110-Therapeutic exercises, 97140- Manual therapy, 97116- Gait training, 09811- Aquatic Therapy, Patient/Family education, Balance training, Dry Needling, Joint mobilization, Joint manipulation, Scar mobilization, DME instructions, Cryotherapy, Moist heat, Therapeutic exercises, Therapeutic activity, Neuromuscular re-education, Gait training, and Self Care  PLAN FOR NEXT SESSION: Continue with Knee ROM and strengthening Exercises: Step Ups or Partial Lunges on Step and continue with sled push.    Ellin Goodie PT, DPT  [facility] [address], [place], [address] Phone: [phone]   Fax:  [phone]

## 2023-04-03 DIAGNOSIS — G5602 Carpal tunnel syndrome, left upper limb: Secondary | ICD-10-CM | POA: Diagnosis not present

## 2023-04-04 ENCOUNTER — Ambulatory Visit: Payer: PPO | Admitting: Physical Therapy

## 2023-04-04 ENCOUNTER — Encounter: Payer: Self-pay | Admitting: Physical Therapy

## 2023-04-04 DIAGNOSIS — R262 Difficulty in walking, not elsewhere classified: Secondary | ICD-10-CM

## 2023-04-04 DIAGNOSIS — M25561 Pain in right knee: Secondary | ICD-10-CM | POA: Diagnosis not present

## 2023-04-04 NOTE — Therapy (Signed)
OUTPATIENT PHYSICAL THERAPY LOWER EXTREMITY TREATMENT    Patient Name: Darrell Freeman MRN: [id number] DOB:06/27/1955, 67 y.o., male Today's Date: 04/04/2023  END OF SESSION:  PT End of Session - 04/04/23 1518     Visit Number 5    Number of Visits 20    Date for PT Re-Evaluation 05/24/23    Authorization Type HTA [id number]    Authorization - Visit Number 5    Authorization - Number of Visits 20    Progress Note Due on Visit 10    PT Start Time 1515    PT Stop Time 1600    PT Time Calculation (min) 45 min    Activity Tolerance Patient tolerated treatment well    Behavior During Therapy WFL for tasks assessed/performed             Past Medical History:  Diagnosis Date   Anxiety    Arthritis    Diverticulosis    GERD (gastroesophageal reflux disease)    GI bleed    Hypertension    Osteoarthritis of right knee    Past Surgical History:  Procedure Laterality Date   COLONOSCOPY WITH PROPOFOL N/A 03/16/2017   Procedure: COLONOSCOPY WITH PROPOFOL;  Surgeon: Wyline Mood, MD;  Location: [facility] ENDOSCOPY;  Service: Gastroenterology;  Laterality: N/A;   ESOPHAGOGASTRODUODENOSCOPY (EGD) WITH PROPOFOL N/A 03/16/2017   Procedure: ESOPHAGOGASTRODUODENOSCOPY (EGD) WITH PROPOFOL;  Surgeon: Wyline Mood, MD;  Location: [facility] ENDOSCOPY;  Service: Gastroenterology;  Laterality: N/A;   OLECRANON BURSA EXCISION     OLECRANON BURSECTOMY Left 12/26/2014   Procedure: OLECRANON BURSA;  Surgeon: Myra Rude, MD;  Location: [facility] ORS;  Service: Orthopedics;  Laterality: Left;   TOTAL KNEE ARTHROPLASTY Right 02/28/2023   Procedure: RIGHT TOTAL KNEE ARTHROPLASTY;  Surgeon: Kathryne Hitch, MD;  Location: [facility] OR;  Service: Orthopedics;  Laterality: Right;   VISCERAL ANGIOGRAPHY N/A 10/17/2018   Procedure: VISCERAL ANGIOGRAPHY;  Surgeon: Annice Needy, MD;  Location: [facility] INVASIVE CV LAB;  Service: Cardiovascular;  Laterality: N/A;   Patient Active Problem List   Diagnosis Date Noted    Status post total right knee replacement 02/28/2023   GIB (gastrointestinal bleeding) 10/17/2018   GI bleed 03/15/2017   HTN (hypertension) 03/15/2017   GERD (gastroesophageal reflux disease) 03/15/2017    PCP: Dr. Clydie Braun   REFERRING PROVIDER: Dr. Doneen Poisson   REFERRING DIAG: Right Total Knee Arthroplasty   THERAPY DIAG:  Acute pain of right knee  Difficulty in walking, not elsewhere classified  Rationale for Evaluation and Treatment: Rehabilitation  ONSET DATE: 02/28/23  SUBJECTIVE:   SUBJECTIVE STATEMENT: Pt states that he continues to be able to perform strengthening exercises without difficulty. He was even able to perform a jumper.    PERTINENT HISTORY: Pt reports that he had a relatively smooth surgery and that he continues to perform exercises that were given to him by HHPT. His pain is well controlled and he is now 4 weeks post op.  PAIN:  Are you having pain? Yes: NPRS scale: 1-2/10 Pain location: Right Anterior Knee  Pain description: Achy and Dull  Aggravating factors: Flexing knee  Relieving factors: Iciing and elevation and pain medications   PRECAUTIONS: None  RED FLAGS: None   WEIGHT BEARING RESTRICTIONS: No  FALLS:  Has patient fallen in last 6 months? No  LIVING ENVIRONMENT: Lives with: lives with their family and lives with their spouse Lives in: House/apartment Stairs: Yes: External: 2 steps; none Has following equipment at home: Dan Humphreys - 2 wheeled  OCCUPATION: Lab Technologist    PLOF: Independent  PATIENT GOALS: Wants to get back to his normal self especially exercising regularly.   NEXT MD VISIT: Nov 5th or 6th   OBJECTIVE:  Note: Objective measures were completed at Evaluation unless otherwise noted.  VITALS: BP 118/74 HR 80 SpO2 100%  DIAGNOSTIC FINDINGS:  CLINICAL DATA:  Status post right knee replacement.   EXAM: PORTABLE RIGHT KNEE - 1-2 VIEW   COMPARISON:  None Available.   FINDINGS: Right knee  arthroplasty in expected alignment. Long tibial stem. No periprosthetic lucency or fracture. There has been patellar resurfacing. Recent postsurgical change includes air and edema in the soft tissues and joint space. Anterior skin staples in place.   IMPRESSION: Right knee arthroplasty without immediate postoperative complication.     Electronically Signed   By: Narda Rutherford M.D.   On: 02/28/2023 13:23  PATIENT SURVEYS:  FOTO 59 with target of 69   COGNITION: Overall cognitive status: Within functional limits for tasks assessed     SENSATION: WFL  EDEMA:  Circumferential:  R 18"/ L 15"  MUSCLE LENGTH: Hamstrings: Right 70 deg; Left 70 deg Thomas test: Not tested   POSTURE: No Significant postural limitations  PALPATION: Right anterior knee   LOWER EXTREMITY ROM:  Active/ Passive ROM Right eval Left eval  Hip flexion    Hip extension    Hip abduction    Hip adduction    Hip internal rotation    Hip external rotation    Knee flexion 70/70 120/120  Knee extension 5/5 2/2  Ankle dorsiflexion    Ankle plantarflexion    Ankle inversion    Ankle eversion     (Blank rows = not tested)  LOWER EXTREMITY MMT:  MMT Right eval Left eval  Hip flexion    Hip extension    Hip abduction    Hip adduction    Hip internal rotation    Hip external rotation    Knee flexion 4- 4+  Knee extension 4- 4+  Ankle dorsiflexion    Ankle plantarflexion    Ankle inversion    Ankle eversion     (Blank rows = not tested)   FUNCTIONAL TESTS:  None performed   GAIT: Distance walked: 50 ft  Assistive device utilized: None Level of assistance: Modified independence Comments: 2WW with decreased step length    TODAY'S TREATMENT:                                                                                                                              DATE: All single leg exercises performed on RLE    04/04/23: All single leg exercises performed on RLE  Nu-Step  with seat a 10 for 5 min and resistance 3  Step Up onto 6 inch step on RLE 1 x 10  Step Up onto 6 inch step with #10 DB 1 x 10  Step Down from 6 inch step on RLE  with BUE support 1 x 10  -mod VC to tap heel  Lunges with right rotations holding #10 KB 1 x 10  Lunges with left rotation 1 x 10 Stationary Lunge 1 x 10  Stationary Lunge holding #10 KB 1 x 10  OMEGA Leg Press #45 on RLE  3 x 10  SLS on RLE 5 x 10 sec in corner of room  FOTO: 63  Knee A/ROM: -Knee Flex R 99/99 -Knee Ext  R 7/7    03/30/23: Nu-Step with seat a 10 for 5 min and resistance 3  OMEGA Leg Press BLE #45 1 x 10  OMEGA Leg Press BLE #75 1 x 10  OMEGA Leg Press RLE #35 1 x 10  OMEGA Leg Press RLE #45 2 x 10  TRX Squat with 1 x 10  TRX Squat with 1 x 10  -mod VC to increase hip flexion  -visual input with mirror  Partial Lunge with 1 UE 3 X 10  -min VC to decrease ankle external rotation  Sled Push and Pull 10 M x 6  Squat with Hip   03/23/23: Nu-Step with seat at 9 for 5 min and resistance 1 Matrix Recumbent Bicycle seat at 17 with half rotations 3 min  10 meter walk x 6 without SPC with heel to toe step sequence.  10 meter walk over 3 obstacles  Standing Quad Set against wall with pillow behind R knee 5 sec hold 1 x 10  -Pt reports increase in NRPS of 5/10  Seated Knee Hang with #5 lbs in backpack for 5 min   03/20/23: Nu-Step with seat and arms at 9 for 5 min  Prone Knee Hang on RLE for 5 min  Advanced Straight Leg Raise on RLE with yellow band 2 x 10  Advanced Straight Leg Raise on RLE with red band 1 x 10  Quad Set on RLE 5 sec isometric hold on RLE 1 x 10  Standing Quad Set with ball behind knee against wall 5 sec hold 1 x 10  Long Sitting Calf Stretch 3 x 30 sec    Eval  Knee Flexion AAROM 1 x 10  with 5 sec hold Short Arc Quads 2 x 10 with 2 sec hold  Straight Leg Raise 1 x 10  Gait Training with use of SPC  -min VC to decrease step length for safety purposes   PATIENT EDUCATION:   Education details: Form and technique for correct performance of exercise  Person educated: Patient Education method: Explanation, Demonstration, Verbal cues, and Handouts Education comprehension: verbalized understanding, returned demonstration, and verbal cues required  HOME EXERCISE PROGRAM: Access Code: [email or web address] URL: [email or web address] Date: 04/04/2023 Prepared by: Ellin Goodie  Exercises - Prone Knee Extension with Ankle Weight  - 1 x daily - 2-3 reps - 5 min  hold - Seated Hamstring Stretch with Chair  - 1 x daily - 3 reps - 5-10 minutes  hold - Long Sitting Calf Stretch with Strap  - 2-3 x daily - 3 reps - 30-60 sec sec  hold - Gastroc Stretch on Step  - 2-3 x weekly - 3 reps - 60 sec  hold - Standing Partial Lunge  - 3-4 x weekly - 3 sets - 10 reps - Single Leg Stance  - 1 x daily - 5 reps - 10 sec  hold - Forward Step Down with Heel Tap and Counter Support  - 3-4 x weekly - 3 sets - 10 reps   ASSESSMENT:  CLINICAL IMPRESSION: Pt is s/p 5 weeks for right TKA. Pt demonstrates ongoing improvements with RLE strength with ability to perform single leg exercises with increased resistance without an increase in his knee pain. He also shows improved knee flexion ROM with ongoing restriction with knee extension. He will continue to benefit from skilled PT to improve right knee ROM and strength to return to recreational exercise specifically cardio without pain or discomfort.   OBJECTIVE IMPAIRMENTS: Abnormal gait, decreased balance, difficulty walking, hypomobility, increased edema, impaired flexibility, and pain.   ACTIVITY LIMITATIONS: carrying, lifting, standing, squatting, stairs, bathing, dressing, and locomotion level  PARTICIPATION LIMITATIONS: cleaning, laundry, driving, occupation, and yard work  PERSONAL FACTORS: Age are also affecting patient's functional outcome.   REHAB POTENTIAL: Excellent  CLINICAL DECISION MAKING:  Stable/uncomplicated  EVALUATION COMPLEXITY: Low   GOALS: Goals reviewed with patient? No  SHORT TERM GOALS: Target date: 03/30/2023  PT reviewed the following HEP with patient with patient able to demonstrate a set of the following with min cuing for correction needed. PT educated patient on parameters of therex (how/when to inc/decrease intensity, frequency, rep/set range, stretch hold time, and purpose of therex) with verbalized understanding.  Baseline: NT 03/20/23: Able to perform independently  Goal status: ACHIEVED    LONG TERM GOALS: Target date: 05/25/2023  Patient will have improved function and activity level as evidenced by an increase in FOTO score by 10 points or more.  Baseline: 59 with target of 69 04/04/23: 63 Goal status: ONGOING   2.  Patient will demonstrate symmetrical hip and knee strength between RLE and LLE as evidence of improved right knee function.  Baseline: Knee Flex R/L 4-/4, Knee Ext R/L 4-/4  04/04/23: Knee Flex R 4+, Knee Flex R 4+ Goal status: ACHIEVED   3.  Patient will demonstrate symmetrical knee AROM between RLE and LLE as evidence of improved knee function. Baseline: Knee Flex R/L 70/120, Knee Ext R/L 5/2 04/04/23: Knee Flex R 99/ Ext R 7  Goal status: PARTIALLY MET    4.  Patient will demonstrate symmetrical knee girth between RLE and LLE for improved right knee function and evidence of healing.  Baseline: Knee Circumference R/L 18"/15" Goal status: ONGOING     PLAN:  PT FREQUENCY: 1-2x/week  PT DURATION: 10 weeks  PLANNED INTERVENTIONS: 97146- PT Re-evaluation, 97110-Therapeutic exercises, 97140- Manual therapy, 97116- Gait training, 40981- Aquatic Therapy, Patient/Family education, Balance training, Dry Needling, Joint mobilization, Joint manipulation, Scar mobilization, DME instructions, Cryotherapy, Moist heat, Therapeutic exercises, Therapeutic activity, Neuromuscular re-education, Gait training, and Self Care  PLAN FOR NEXT  SESSION: Single Leg Stance on foam, sled push and progression of lunges   Ellin Goodie PT, DPT  [facility] [address] [place], [address] Phone: [phone]   Fax:  [phone]

## 2023-04-06 ENCOUNTER — Ambulatory Visit: Payer: PPO | Admitting: Physical Therapy

## 2023-04-06 DIAGNOSIS — R262 Difficulty in walking, not elsewhere classified: Secondary | ICD-10-CM

## 2023-04-06 DIAGNOSIS — M25561 Pain in right knee: Secondary | ICD-10-CM | POA: Diagnosis not present

## 2023-04-06 NOTE — Therapy (Signed)
OUTPATIENT PHYSICAL THERAPY LOWER EXTREMITY TREATMENT    Patient Name: Darrell Freeman MRN: [id number] DOB:29-Oct-1955, 67 y.o., male Today's Date: 04/06/2023  END OF SESSION:  PT End of Session - 04/06/23 0952     Visit Number 6    Number of Visits 20    Date for PT Re-Evaluation 05/24/23    Authorization Type HTA [id number]    Authorization - Visit Number 6    Authorization - Number of Visits 20    Progress Note Due on Visit 10    PT Start Time 0950    PT Stop Time 1030    PT Time Calculation (min) 40 min    Activity Tolerance Patient tolerated treatment well    Behavior During Therapy WFL for tasks assessed/performed              Past Medical History:  Diagnosis Date   Anxiety    Arthritis    Diverticulosis    GERD (gastroesophageal reflux disease)    GI bleed    Hypertension    Osteoarthritis of right knee    Past Surgical History:  Procedure Laterality Date   COLONOSCOPY WITH PROPOFOL N/A 03/16/2017   Procedure: COLONOSCOPY WITH PROPOFOL;  Surgeon: Wyline Mood, MD;  Location: [facility] ENDOSCOPY;  Service: Gastroenterology;  Laterality: N/A;   ESOPHAGOGASTRODUODENOSCOPY (EGD) WITH PROPOFOL N/A 03/16/2017   Procedure: ESOPHAGOGASTRODUODENOSCOPY (EGD) WITH PROPOFOL;  Surgeon: Wyline Mood, MD;  Location: [facility] ENDOSCOPY;  Service: Gastroenterology;  Laterality: N/A;   OLECRANON BURSA EXCISION     OLECRANON BURSECTOMY Left 12/26/2014   Procedure: OLECRANON BURSA;  Surgeon: Myra Rude, MD;  Location: [facility] ORS;  Service: Orthopedics;  Laterality: Left;   TOTAL KNEE ARTHROPLASTY Right 02/28/2023   Procedure: RIGHT TOTAL KNEE ARTHROPLASTY;  Surgeon: Kathryne Hitch, MD;  Location: [facility] OR;  Service: Orthopedics;  Laterality: Right;   VISCERAL ANGIOGRAPHY N/A 10/17/2018   Procedure: VISCERAL ANGIOGRAPHY;  Surgeon: Annice Needy, MD;  Location: [facility] INVASIVE CV LAB;  Service: Cardiovascular;  Laterality: N/A;   Patient Active Problem List   Diagnosis Date Noted    Status post total right knee replacement 02/28/2023   GIB (gastrointestinal bleeding) 10/17/2018   GI bleed 03/15/2017   HTN (hypertension) 03/15/2017   GERD (gastroesophageal reflux disease) 03/15/2017    PCP: Dr. Clydie Braun   REFERRING PROVIDER: Dr. Doneen Poisson   REFERRING DIAG: Right Total Knee Arthroplasty   THERAPY DIAG:  Acute pain of right knee  Difficulty in walking, not elsewhere classified  Rationale for Evaluation and Treatment: Rehabilitation  ONSET DATE: 02/28/23  SUBJECTIVE:   SUBJECTIVE STATEMENT: Pt reports that he continues to perform exercises without much difficulty with the exception of the balance exercise.    PERTINENT HISTORY: Pt reports that he had a relatively smooth surgery and that he continues to perform exercises that were given to him by HHPT. His pain is well controlled and he is now 4 weeks post op.  PAIN:  Are you having pain? Yes: NPRS scale: 1-2/10 Pain location: Right Anterior Knee  Pain description: Achy and Dull  Aggravating factors: Flexing knee  Relieving factors: Iciing and elevation and pain medications   PRECAUTIONS: None  RED FLAGS: None   WEIGHT BEARING RESTRICTIONS: No  FALLS:  Has patient fallen in last 6 months? No  LIVING ENVIRONMENT: Lives with: lives with their family and lives with their spouse Lives in: House/apartment Stairs: Yes: External: 2 steps; none Has following equipment at home: Dan Humphreys - 2 wheeled  OCCUPATION: Lab  Technologist    PLOF: Independent  PATIENT GOALS: Wants to get back to his normal self especially exercising regularly.   NEXT MD VISIT: Nov 5th or 6th   OBJECTIVE:  Note: Objective measures were completed at Evaluation unless otherwise noted.  VITALS: BP 118/74 HR 80 SpO2 100%  DIAGNOSTIC FINDINGS:  CLINICAL DATA:  Status post right knee replacement.   EXAM: PORTABLE RIGHT KNEE - 1-2 VIEW   COMPARISON:  None Available.   FINDINGS: Right knee arthroplasty in  expected alignment. Long tibial stem. No periprosthetic lucency or fracture. There has been patellar resurfacing. Recent postsurgical change includes air and edema in the soft tissues and joint space. Anterior skin staples in place.   IMPRESSION: Right knee arthroplasty without immediate postoperative complication.     Electronically Signed   By: Narda Rutherford M.D.   On: 02/28/2023 13:23  PATIENT SURVEYS:  FOTO 59 with target of 69   COGNITION: Overall cognitive status: Within functional limits for tasks assessed     SENSATION: WFL  EDEMA:  Circumferential:  R 18"/ L 15"  MUSCLE LENGTH: Hamstrings: Right 70 deg; Left 70 deg Thomas test: Not tested   POSTURE: No Significant postural limitations  PALPATION: Right anterior knee   LOWER EXTREMITY ROM:  Active/ Passive ROM Right eval Left eval  Hip flexion    Hip extension    Hip abduction    Hip adduction    Hip internal rotation    Hip external rotation    Knee flexion 70/70 120/120  Knee extension 5/5 2/2  Ankle dorsiflexion    Ankle plantarflexion    Ankle inversion    Ankle eversion     (Blank rows = not tested)  LOWER EXTREMITY MMT:  MMT Right eval Left eval  Hip flexion    Hip extension    Hip abduction    Hip adduction    Hip internal rotation    Hip external rotation    Knee flexion 4- 4+  Knee extension 4- 4+  Ankle dorsiflexion    Ankle plantarflexion    Ankle inversion    Ankle eversion     (Blank rows = not tested)   FUNCTIONAL TESTS:  None performed   GAIT: Distance walked: 50 ft  Assistive device utilized: None Level of assistance: Modified independence Comments: 2WW with decreased step length    TODAY'S TREATMENT:                                                                                                                              DATE: All single leg exercises performed on RLE     04/06/23: Nu-Step with seat 9 with resistance at 4 for 5 min  Monster Walks  with red band 10 M x 8 -30 step distance  OMEGA Leg Press #45 on RLE  3 x 10 Lateral Lunge into Squat  2 x 10 Scar Tissue Massage video and demonstration  Double Heel Raises with  BUE support 1 x 10  -PT struggles to raise heels without forward lean  Standing Calf Stretch 2 x 30 sec    04/04/23: All single leg exercises performed on RLE  Nu-Step with seat a 10 for 5 min and resistance 3  Step Up onto 6 inch step on RLE 1 x 10  Step Up onto 6 inch step with #10 DB 1 x 10  Step Down from 6 inch step on RLE with BUE support 1 x 10  -mod VC to tap heel  Lunges with right rotations holding #10 KB 1 x 10  Lunges with left rotation 1 x 10 Stationary Lunge 1 x 10  Stationary Lunge holding #10 KB 1 x 10  OMEGA Leg Press #45 on RLE  3 x 10  SLS on RLE 5 x 10 sec in corner of room  FOTO: 63  Knee A/ROM: -Knee Flex R 99/99 -Knee Ext  R 7/7    03/30/23: Nu-Step with seat a 10 for 5 min and resistance 3  OMEGA Leg Press BLE #45 1 x 10  OMEGA Leg Press BLE #75 1 x 10  OMEGA Leg Press RLE #35 1 x 10  OMEGA Leg Press RLE #45 2 x 10  TRX Squat with 1 x 10  TRX Squat with 1 x 10  -mod VC to increase hip flexion  -visual input with mirror  Partial Lunge with 1 UE 3 X 10  -min VC to decrease ankle external rotation  Sled Push and Pull 10 M x 6  Squat with Hip   03/23/23: Nu-Step with seat at 9 for 5 min and resistance 1 Matrix Recumbent Bicycle seat at 17 with half rotations 3 min  10 meter walk x 6 without SPC with heel to toe step sequence.  10 meter walk over 3 obstacles  Standing Quad Set against wall with pillow behind R knee 5 sec hold 1 x 10  -Pt reports increase in NRPS of 5/10  Seated Knee Hang with #5 lbs in backpack for 5 min    PATIENT EDUCATION:  Education details: Form and technique for correct performance of exercise  Person educated: Patient Education method: Explanation, Demonstration, Verbal cues, and Handouts Education comprehension: verbalized understanding,  returned demonstration, and verbal cues required  HOME EXERCISE PROGRAM: Access Code: [email or web address] URL: [email or web address] Date: 04/06/2023 Prepared by: Ellin Goodie  Exercises - Prone Knee Extension with Ankle Weight  - 1 x daily - 2-3 reps - 5 min  hold - Seated Hamstring Stretch with Chair  - 1 x daily - 3 reps - 5-10 minutes  hold - Long Sitting Calf Stretch with Strap  - 2-3 x daily - 3 reps - 30-60 sec sec  hold - Gastroc Stretch on Step  - 2-3 x weekly - 3 reps - 60 sec  hold - Standing Partial Lunge  - 3-4 x weekly - 3 sets - 10 reps - Single Leg Stance  - 1 x daily - 5 reps - 10 sec  hold - Forward Step Down with Heel Tap and Counter Support  - 3-4 x weekly - 3 sets - 10 reps - Standing Heel Raise  - 3-4 x weekly - 3 sets - 10 reps  Patient Education - Scar Massage  ASSESSMENT:  CLINICAL IMPRESSION: Pt is s/p 5 weeks for right TKA. Continued to progress right knee strengthening exercises. Pt shows decreased ankle mobility and ankle strength with difficulty performing standing calf raises even with double limb support. He  will continue to benefit from skilled PT to improve right knee ROM and strength to return to recreational exercise specifically cardio without pain or discomfort.   OBJECTIVE IMPAIRMENTS: Abnormal gait, decreased balance, difficulty walking, hypomobility, increased edema, impaired flexibility, and pain.   ACTIVITY LIMITATIONS: carrying, lifting, standing, squatting, stairs, bathing, dressing, and locomotion level  PARTICIPATION LIMITATIONS: cleaning, laundry, driving, occupation, and yard work  PERSONAL FACTORS: Age are also affecting patient's functional outcome.   REHAB POTENTIAL: Excellent  CLINICAL DECISION MAKING: Stable/uncomplicated  EVALUATION COMPLEXITY: Low   GOALS: Goals reviewed with patient? No  SHORT TERM GOALS: Target date: 03/30/2023  PT reviewed the following HEP with patient with patient able to demonstrate a set  of the following with min cuing for correction needed. PT educated patient on parameters of therex (how/when to inc/decrease intensity, frequency, rep/set range, stretch hold time, and purpose of therex) with verbalized understanding.  Baseline: NT 03/20/23: Able to perform independently  Goal status: ACHIEVED    LONG TERM GOALS: Target date: 05/25/2023  Patient will have improved function and activity level as evidenced by an increase in FOTO score by 10 points or more.  Baseline: 59 with target of 69 04/04/23: 63 Goal status: ONGOING   2.  Patient will demonstrate symmetrical hip and knee strength between RLE and LLE as evidence of improved right knee function.  Baseline: Knee Flex R/L 4-/4, Knee Ext R/L 4-/4  04/04/23: Knee Flex R 4+, Knee Flex R 4+ Goal status: ACHIEVED   3.  Patient will demonstrate symmetrical knee AROM between RLE and LLE as evidence of improved knee function. Baseline: Knee Flex R/L 70/120, Knee Ext R/L 5/2 04/04/23: Knee Flex R 99/ Ext R 7  Goal status: PARTIALLY MET    4.  Patient will demonstrate symmetrical knee girth between RLE and LLE for improved right knee function and evidence of healing.  Baseline: Knee Circumference R/L 18"/15" Goal status: ONGOING     PLAN:  PT FREQUENCY: 1-2x/week  PT DURATION: 10 weeks  PLANNED INTERVENTIONS: 97146- PT Re-evaluation, 97110-Therapeutic exercises, 97140- Manual therapy, 97116- Gait training, 16109- Aquatic Therapy, Patient/Family education, Balance training, Dry Needling, Joint mobilization, Joint manipulation, Scar mobilization, DME instructions, Cryotherapy, Moist heat, Therapeutic exercises, Therapeutic activity, Neuromuscular re-education, Gait training, and Self Care  PLAN FOR NEXT SESSION: Single Leg Stance on foam, sled push and progression of lunges, continue to progress calf strength   Ellin Goodie PT, DPT  [facility] [address], [place],  [address] Phone: [phone]   Fax:  [phone]

## 2023-04-11 ENCOUNTER — Ambulatory Visit: Payer: PPO | Attending: Orthopaedic Surgery | Admitting: Physical Therapy

## 2023-04-11 DIAGNOSIS — M25561 Pain in right knee: Secondary | ICD-10-CM | POA: Diagnosis not present

## 2023-04-11 DIAGNOSIS — R262 Difficulty in walking, not elsewhere classified: Secondary | ICD-10-CM | POA: Insufficient documentation

## 2023-04-11 NOTE — Therapy (Signed)
OUTPATIENT PHYSICAL THERAPY LOWER EXTREMITY TREATMENT    Patient Name: Darrell Freeman MRN: [id number] DOB:[birth year], 67 y.o., male Today's Date: 04/11/2023  END OF SESSION:  PT End of Session - 04/11/23 1518     Visit Number 7    Number of Visits 20    Date for PT Re-Evaluation 05/24/23    Authorization Type HTA [id number]    Authorization - Visit Number 7    Authorization - Number of Visits 20    Progress Note Due on Visit 10    PT Start Time 1515    PT Stop Time 1600    PT Time Calculation (min) 45 min    Activity Tolerance Patient tolerated treatment well    Behavior During Therapy WFL for tasks assessed/performed              Past Medical History:  Diagnosis Date   Anxiety    Arthritis    Diverticulosis    GERD (gastroesophageal reflux disease)    GI bleed    Hypertension    Osteoarthritis of right knee    Past Surgical History:  Procedure Laterality Date   COLONOSCOPY WITH PROPOFOL N/A 03/16/2017   Procedure: COLONOSCOPY WITH PROPOFOL;  Surgeon: Wyline Mood, MD;  Location: [facility] ENDOSCOPY;  Service: Gastroenterology;  Laterality: N/A;   ESOPHAGOGASTRODUODENOSCOPY (EGD) WITH PROPOFOL N/A 03/16/2017   Procedure: ESOPHAGOGASTRODUODENOSCOPY (EGD) WITH PROPOFOL;  Surgeon: Wyline Mood, MD;  Location: [facility] ENDOSCOPY;  Service: Gastroenterology;  Laterality: N/A;   OLECRANON BURSA EXCISION     OLECRANON BURSECTOMY Left 12/26/2014   Procedure: OLECRANON BURSA;  Surgeon: Myra Rude, MD;  Location: [facility] ORS;  Service: Orthopedics;  Laterality: Left;   TOTAL KNEE ARTHROPLASTY Right 02/28/2023   Procedure: RIGHT TOTAL KNEE ARTHROPLASTY;  Surgeon: Kathryne Hitch, MD;  Location: [facility] OR;  Service: Orthopedics;  Laterality: Right;   VISCERAL ANGIOGRAPHY N/A 10/17/2018   Procedure: VISCERAL ANGIOGRAPHY;  Surgeon: Annice Needy, MD;  Location: [facility] INVASIVE CV LAB;  Service: Cardiovascular;  Laterality: N/A;   Patient Active Problem List   Diagnosis Date Noted    Status post total right knee replacement 02/28/2023   GIB (gastrointestinal bleeding) 10/17/2018   GI bleed 03/15/2017   HTN (hypertension) 03/15/2017   GERD (gastroesophageal reflux disease) 03/15/2017    PCP: Dr. Clydie Braun   REFERRING PROVIDER: Dr. Doneen Poisson   REFERRING DIAG: Right Total Knee Arthroplasty   THERAPY DIAG:  Acute pain of right knee  Difficulty in walking, not elsewhere classified  Rationale for Evaluation and Treatment: Rehabilitation  ONSET DATE: 02/28/23  SUBJECTIVE:   SUBJECTIVE STATEMENT: Pt reports that he had a gout flare after last session and his ankle was hurting a lot. Despite the pain, he was able to do all the exercises without difficulty.    PERTINENT HISTORY: Pt reports that he had a relatively smooth surgery and that he continues to perform exercises that were given to him by HHPT. His pain is well controlled and he is now 4 weeks post op.  PAIN:  Are you having pain? Yes: NPRS scale: 1-2/10 Pain location: Right Anterior Knee  Pain description: Achy and Dull  Aggravating factors: Flexing knee  Relieving factors: Iciing and elevation and pain medications   PRECAUTIONS: None  RED FLAGS: None   WEIGHT BEARING RESTRICTIONS: No  FALLS:  Has patient fallen in last 6 months? No  LIVING ENVIRONMENT: Lives with: lives with their family and lives with their spouse Lives in: House/apartment Stairs: Yes: External: 2 steps;  none Has following equipment at home: Dan Humphreys - 2 wheeled  OCCUPATION: Interior and spatial designer    PLOF: Independent  PATIENT GOALS: Wants to get back to his normal self especially exercising regularly.   NEXT MD VISIT: Nov 5th or 6th   OBJECTIVE:  Note: Objective measures were completed at Evaluation unless otherwise noted.  VITALS: BP 118/74 HR 80 SpO2 100%  DIAGNOSTIC FINDINGS:  CLINICAL DATA:  Status post right knee replacement.   EXAM: PORTABLE RIGHT KNEE - 1-2 VIEW   COMPARISON:  None  Available.   FINDINGS: Right knee arthroplasty in expected alignment. Long tibial stem. No periprosthetic lucency or fracture. There has been patellar resurfacing. Recent postsurgical change includes air and edema in the soft tissues and joint space. Anterior skin staples in place.   IMPRESSION: Right knee arthroplasty without immediate postoperative complication.     Electronically Signed   By: Narda Rutherford M.D.   On: 02/28/2023 13:23  PATIENT SURVEYS:  FOTO 59 with target of 69   COGNITION: Overall cognitive status: Within functional limits for tasks assessed     SENSATION: WFL  EDEMA:  Circumferential:  R 18"/ L 15"  MUSCLE LENGTH: Hamstrings: Right 70 deg; Left 70 deg Thomas test: Not tested   POSTURE: No Significant postural limitations  PALPATION: Right anterior knee   LOWER EXTREMITY ROM:  Active/ Passive ROM Right eval Left eval  Hip flexion    Hip extension    Hip abduction    Hip adduction    Hip internal rotation    Hip external rotation    Knee flexion 70/70 120/120  Knee extension 5/5 2/2  Ankle dorsiflexion    Ankle plantarflexion    Ankle inversion    Ankle eversion     (Blank rows = not tested)  LOWER EXTREMITY MMT:  MMT Right eval Left eval  Hip flexion    Hip extension    Hip abduction    Hip adduction    Hip internal rotation    Hip external rotation    Knee flexion 4- 4+  Knee extension 4- 4+  Ankle dorsiflexion    Ankle plantarflexion    Ankle inversion    Ankle eversion     (Blank rows = not tested)   FUNCTIONAL TESTS:  None performed   GAIT: Distance walked: 50 ft  Assistive device utilized: None Level of assistance: Modified independence Comments: 2WW with decreased step length    TODAY'S TREATMENT:                                                                                                                              DATE: All single leg exercises performed on RLE     04/11/23 Matrix Recumbent  Bike seat at 15 2.5 min forward and 2.5 backward  Gait Analysis: decreased right terminal knee extension with no hip drop noted  OMEGA Leg Press on RLE #45  3 x 10  -min VC to increased  knee flexion.  Forward Lunges 10 steps forward x 2  Backward Lunges 10 steps back ward x 2   Standing Hip Abduction with 1 UE Support 2 x 10 -min VC to decrease lateral lean.  Sled Push 170 lbs forward and backward 10 m x 6      04/06/23: Nu-Step with seat 9 with resistance at 4 for 5 min  Monster Walks with red band 10 M x 8 -30 step distance  OMEGA Leg Press #45 on RLE  3 x 10 Lateral Lunge into Squat  2 x 10 Scar Tissue Massage video and demonstration  Double Heel Raises with BUE support 1 x 10  -PT struggles to raise heels without forward lean  Standing Calf Stretch 2 x 30 sec    04/04/23: All single leg exercises performed on RLE  Nu-Step with seat a 10 for 5 min and resistance 3  Step Up onto 6 inch step on RLE 1 x 10  Step Up onto 6 inch step with #10 DB 1 x 10  Step Down from 6 inch step on RLE with BUE support 1 x 10  -mod VC to tap heel  Lunges with right rotations holding #10 KB 1 x 10  Lunges with left rotation 1 x 10 Stationary Lunge 1 x 10  Stationary Lunge holding #10 KB 1 x 10  OMEGA Leg Press #45 on RLE  3 x 10  SLS on RLE 5 x 10 sec in corner of room  FOTO: 63  Knee A/ROM: -Knee Flex R 99/99 -Knee Ext  R 7/7    03/30/23: Nu-Step with seat a 10 for 5 min and resistance 3  OMEGA Leg Press BLE #45 1 x 10  OMEGA Leg Press BLE #75 1 x 10  OMEGA Leg Press RLE #35 1 x 10  OMEGA Leg Press RLE #45 2 x 10  TRX Squat with 1 x 10  TRX Squat with 1 x 10  -mod VC to increase hip flexion  -visual input with mirror  Partial Lunge with 1 UE 3 X 10  -min VC to decrease ankle external rotation  Sled Push and Pull 10 M x 6  Squat with Hip    PATIENT EDUCATION:  Education details: Form and technique for correct performance of exercise  Person educated: Patient Education  method: Explanation, Demonstration, Verbal cues, and Handouts Education comprehension: verbalized understanding, returned demonstration, and verbal cues required  HOME EXERCISE PROGRAM: Access Code: [email or web address] URL: [email or web address] Date: 04/06/2023 Prepared by: Ellin Goodie  Exercises - Prone Knee Extension with Ankle Weight  - 1 x daily - 2-3 reps - 5 min  hold - Seated Hamstring Stretch with Chair  - 1 x daily - 3 reps - 5-10 minutes  hold - Long Sitting Calf Stretch with Strap  - 2-3 x daily - 3 reps - 30-60 sec sec  hold - Gastroc Stretch on Step  - 2-3 x weekly - 3 reps - 60 sec  hold - Standing Partial Lunge  - 3-4 x weekly - 3 sets - 10 reps - Single Leg Stance  - 1 x daily - 5 reps - 10 sec  hold - Forward Step Down with Heel Tap and Counter Support  - 3-4 x weekly - 3 sets - 10 reps - Standing Heel Raise  - 3-4 x weekly - 3 sets - 10 reps  Patient Education - Scar Massage  ASSESSMENT:  CLINICAL IMPRESSION: Pt is s/p 6 weeks for right TKA. He  demonstrates improved right knee mobility and strength with ability to perform walking lunges and backward. He will continue to benefit from skilled PT to improve right knee ROM and strength to return to recreational exercise specifically cardio without pain or discomfort.    OBJECTIVE IMPAIRMENTS: Abnormal gait, decreased balance, difficulty walking, hypomobility, increased edema, impaired flexibility, and pain.   ACTIVITY LIMITATIONS: carrying, lifting, standing, squatting, stairs, bathing, dressing, and locomotion level  PARTICIPATION LIMITATIONS: cleaning, laundry, driving, occupation, and yard work  PERSONAL FACTORS: Age are also affecting patient's functional outcome.   REHAB POTENTIAL: Excellent  CLINICAL DECISION MAKING: Stable/uncomplicated  EVALUATION COMPLEXITY: Low   GOALS: Goals reviewed with patient? No  SHORT TERM GOALS: Target date: 03/30/2023  PT reviewed the following HEP with patient  with patient able to demonstrate a set of the following with min cuing for correction needed. PT educated patient on parameters of therex (how/when to inc/decrease intensity, frequency, rep/set range, stretch hold time, and purpose of therex) with verbalized understanding.  Baseline: NT 03/20/23: Able to perform independently  Goal status: ACHIEVED    LONG TERM GOALS: Target date: 05/25/2023  Patient will have improved function and activity level as evidenced by an increase in FOTO score by 10 points or more.  Baseline: 59 with target of 69 04/04/23: 63 Goal status: ONGOING   2.  Patient will demonstrate symmetrical hip and knee strength between RLE and LLE as evidence of improved right knee function.  Baseline: Knee Flex R/L 4-/4, Knee Ext R/L 4-/4  04/04/23: Knee Flex R 4+, Knee Flex R 4+ Goal status: ACHIEVED   3.  Patient will demonstrate symmetrical knee AROM between RLE and LLE as evidence of improved knee function. Baseline: Knee Flex R/L 70/120, Knee Ext R/L 5/2 04/04/23: Knee Flex R 99/ Ext R 7  Goal status: PARTIALLY MET    4.  Patient will demonstrate symmetrical knee girth between RLE and LLE for improved right knee function and evidence of healing.  Baseline: Knee Circumference R/L 18"/15" Goal status: ONGOING     PLAN:  PT FREQUENCY: 1-2x/week  PT DURATION: 10 weeks  PLANNED INTERVENTIONS: 97146- PT Re-evaluation, 97110-Therapeutic exercises, 97140- Manual therapy, 97116- Gait training, 16109- Aquatic Therapy, Patient/Family education, Balance training, Dry Needling, Joint mobilization, Joint manipulation, Scar mobilization, DME instructions, Cryotherapy, Moist heat, Therapeutic exercises, Therapeutic activity, Neuromuscular re-education, Gait training, and Self Care  PLAN FOR NEXT SESSION: Single Leg Stance on foam, calf strengthening in non-weight bearing positions,   Ellin Goodie PT, DPT  [facility] [address], [place], [address] Phone: [phone]   Fax:  [phone]

## 2023-04-12 ENCOUNTER — Other Ambulatory Visit (INDEPENDENT_AMBULATORY_CARE_PROVIDER_SITE_OTHER): Payer: PPO

## 2023-04-12 ENCOUNTER — Ambulatory Visit (INDEPENDENT_AMBULATORY_CARE_PROVIDER_SITE_OTHER): Payer: PPO | Admitting: Orthopaedic Surgery

## 2023-04-12 DIAGNOSIS — Z96651 Presence of right artificial knee joint: Secondary | ICD-10-CM

## 2023-04-12 DIAGNOSIS — M7052 Other bursitis of knee, left knee: Secondary | ICD-10-CM

## 2023-04-12 DIAGNOSIS — Z8739 Personal history of other diseases of the musculoskeletal system and connective tissue: Secondary | ICD-10-CM

## 2023-04-12 DIAGNOSIS — R2242 Localized swelling, mass and lump, left lower limb: Secondary | ICD-10-CM

## 2023-04-12 NOTE — Progress Notes (Signed)
The patient is a 67 year old gentleman who is only 6 weeks out from a right total knee arthroplasty.  His knee replacement is interesting to the fact that a large cystic mass consistent with gout in the metaphyseal area of the proximal tibia so we did place a longstem implant with a cone as well.  He says that knee is doing great.  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  He would like to have this excised.  His right knee does have swelling at 6 weeks to be expected but his range of motion is excellent and full.  The knee feels limply stable.  Examination of his left knee shows a large mass over the prepatellar area consistent with a gouty tophi with some bursitis.  An AP and lateral of the right knee shows a well-seated total knee arthroplasty with no complicating features.  I am pleased with the tibial component and how it appears thus far.  We are recommending an excision of his left knee prepatellar gouty tophi/bursa.  This is really irritating to him when he is getting down on his knee and bending his knee and has been there for a long period time.  He would like to have this excised and we can do this as an outpatient.  I described what the surgery involves as well as the risk and benefits of surgery.  Will work on getting this set up as an outpatient soon and then would see him back in 2 weeks postoperative for suture removal.  He is scheduled to return to work 19 December and I do not think this will impact his return to work at all and the recovery should be short-term hopefully.

## 2023-04-13 ENCOUNTER — Ambulatory Visit: Payer: PPO | Admitting: Physical Therapy

## 2023-04-18 ENCOUNTER — Ambulatory Visit: Payer: PPO | Admitting: Physical Therapy

## 2023-04-18 DIAGNOSIS — M25561 Pain in right knee: Secondary | ICD-10-CM

## 2023-04-18 DIAGNOSIS — R262 Difficulty in walking, not elsewhere classified: Secondary | ICD-10-CM

## 2023-04-18 NOTE — Therapy (Signed)
OUTPATIENT PHYSICAL THERAPY LOWER EXTREMITY TREATMENT    Patient Name: Darrell Freeman MRN: [id number] DOB:[birth year], 67 y.o., male Today's Date: 04/18/2023  END OF SESSION:  PT End of Session - 04/18/23 1424     Visit Number 8    Number of Visits 20    Date for PT Re-Evaluation 05/24/23    Authorization Type HTA [id number]    Authorization - Visit Number 8    Authorization - Number of Visits 20    Progress Note Due on Visit 10    PT Start Time 1344    PT Stop Time 1422    PT Time Calculation (min) 38 min    Activity Tolerance Patient tolerated treatment well    Behavior During Therapy WFL for tasks assessed/performed               Past Medical History:  Diagnosis Date   Anxiety    Arthritis    Diverticulosis    GERD (gastroesophageal reflux disease)    GI bleed    Hypertension    Osteoarthritis of right knee    Past Surgical History:  Procedure Laterality Date   COLONOSCOPY WITH PROPOFOL N/A 03/16/2017   Procedure: COLONOSCOPY WITH PROPOFOL;  Surgeon: Wyline Mood, MD;  Location: [facility] ENDOSCOPY;  Service: Gastroenterology;  Laterality: N/A;   ESOPHAGOGASTRODUODENOSCOPY (EGD) WITH PROPOFOL N/A 03/16/2017   Procedure: ESOPHAGOGASTRODUODENOSCOPY (EGD) WITH PROPOFOL;  Surgeon: Wyline Mood, MD;  Location: [facility] ENDOSCOPY;  Service: Gastroenterology;  Laterality: N/A;   OLECRANON BURSA EXCISION     OLECRANON BURSECTOMY Left 12/26/2014   Procedure: OLECRANON BURSA;  Surgeon: Myra Rude, MD;  Location: [facility] ORS;  Service: Orthopedics;  Laterality: Left;   TOTAL KNEE ARTHROPLASTY Right 02/28/2023   Procedure: RIGHT TOTAL KNEE ARTHROPLASTY;  Surgeon: Kathryne Hitch, MD;  Location: [facility] OR;  Service: Orthopedics;  Laterality: Right;   VISCERAL ANGIOGRAPHY N/A 10/17/2018   Procedure: VISCERAL ANGIOGRAPHY;  Surgeon: Annice Needy, MD;  Location: [facility] INVASIVE CV LAB;  Service: Cardiovascular;  Laterality: N/A;   Patient Active Problem List   Diagnosis Date Noted    Status post total right knee replacement 02/28/2023   GIB (gastrointestinal bleeding) 10/17/2018   GI bleed 03/15/2017   HTN (hypertension) 03/15/2017   GERD (gastroesophageal reflux disease) 03/15/2017    PCP: Dr. Clydie Braun   REFERRING PROVIDER: Dr. Doneen Poisson   REFERRING DIAG: Right Total Knee Arthroplasty   THERAPY DIAG:  No diagnosis found.  Rationale for Evaluation and Treatment: Rehabilitation  ONSET DATE: 02/28/23  SUBJECTIVE:   SUBJECTIVE STATEMENT: Pt reports his gout flare is still bothering his ankle. This is why he missed session last Thursday.    PERTINENT HISTORY: Pt reports that he had a relatively smooth surgery and that he continues to perform exercises that were given to him by HHPT. His pain is well controlled and he is now 4 weeks post op.  PAIN:  Are you having pain? Yes: NPRS scale: 1-2/10 Pain location: Right Anterior Knee  Pain description: Achy and Dull  Aggravating factors: Flexing knee  Relieving factors: Iciing and elevation and pain medications   PRECAUTIONS: None  RED FLAGS: None   WEIGHT BEARING RESTRICTIONS: No  FALLS:  Has patient fallen in last 6 months? No  LIVING ENVIRONMENT: Lives with: lives with their family and lives with their spouse Lives in: House/apartment Stairs: Yes: External: 2 steps; none Has following equipment at home: Dan Humphreys - 2 wheeled  OCCUPATION: Interior and spatial designer    PLOF: Independent  PATIENT  GOALS: Wants to get back to his normal self especially exercising regularly.   NEXT MD VISIT: Nov 5th or 6th   OBJECTIVE:  Note: Objective measures were completed at Evaluation unless otherwise noted.  VITALS: BP 118/74 HR 80 SpO2 100%  DIAGNOSTIC FINDINGS:  CLINICAL DATA:  Status post right knee replacement.   EXAM: PORTABLE RIGHT KNEE - 1-2 VIEW   COMPARISON:  None Available.   FINDINGS: Right knee arthroplasty in expected alignment. Long tibial stem. No periprosthetic lucency or  fracture. There has been patellar resurfacing. Recent postsurgical change includes air and edema in the soft tissues and joint space. Anterior skin staples in place.   IMPRESSION: Right knee arthroplasty without immediate postoperative complication.     Electronically Signed   By: Narda Rutherford M.D.   On: 02/28/2023 13:23  PATIENT SURVEYS:  FOTO 59 with target of 69   COGNITION: Overall cognitive status: Within functional limits for tasks assessed     SENSATION: WFL  EDEMA:  Circumferential:  R 18"/ L 15"  MUSCLE LENGTH: Hamstrings: Right 70 deg; Left 70 deg Thomas test: Not tested   POSTURE: No Significant postural limitations  PALPATION: Right anterior knee   LOWER EXTREMITY ROM:  Active/ Passive ROM Right eval Left eval  Hip flexion    Hip extension    Hip abduction    Hip adduction    Hip internal rotation    Hip external rotation    Knee flexion 70/70 120/120  Knee extension 5/5 2/2  Ankle dorsiflexion    Ankle plantarflexion    Ankle inversion    Ankle eversion     (Blank rows = not tested)  LOWER EXTREMITY MMT:  MMT Right eval Left eval  Hip flexion    Hip extension    Hip abduction    Hip adduction    Hip internal rotation    Hip external rotation    Knee flexion 4- 4+  Knee extension 4- 4+  Ankle dorsiflexion    Ankle plantarflexion    Ankle inversion    Ankle eversion     (Blank rows = not tested)   FUNCTIONAL TESTS:  None performed   GAIT: Distance walked: 50 ft  Assistive device utilized: None Level of assistance: Modified independence Comments: 2WW with decreased step length    TODAY'S TREATMENT:                                                                                                                              DATE: All single leg exercises performed on RLE    04/18/23 Matrix Recumbent Bike seat at 15 2.5 min forward and 2.5 backward  OMEGA Leg Press on RLE #60  3 x 6  Sled Push 190 lbs forward and  backward 10 m x 6  Forward Lunges @15 # 10 steps forward x 1  Backward Lunges @15 # 10 steps back ward x 1 Squats to max depth with arms on stable bar  for support 2x10       04/11/23 Matrix Recumbent Bike seat at 15 2.5 min forward and 2.5 backward  Gait Analysis: decreased right terminal knee extension with no hip drop noted  OMEGA Leg Press on RLE #45  3 x 10  -min VC to increased knee flexion.  Forward Lunges 10 steps forward x 2  Backward Lunges 10 steps back ward x 2   Standing Hip Abduction with 1 UE Support 2 x 10 -min VC to decrease lateral lean.  Sled Push 170 lbs forward and backward 10 m x 6      04/06/23: Nu-Step with seat 9 with resistance at 4 for 5 min  Monster Walks with red band 10 M x 8 -30 step distance  OMEGA Leg Press #45 on RLE  3 x 10 Lateral Lunge into Squat  2 x 10 Scar Tissue Massage video and demonstration  Double Heel Raises with BUE support 1 x 10  -PT struggles to raise heels without forward lean  Standing Calf Stretch 2 x 30 sec    04/04/23: All single leg exercises performed on RLE  Nu-Step with seat a 10 for 5 min and resistance 3  Step Up onto 6 inch step on RLE 1 x 10  Step Up onto 6 inch step with #10 DB 1 x 10  Step Down from 6 inch step on RLE with BUE support 1 x 10  -mod VC to tap heel  Lunges with right rotations holding #10 KB 1 x 10  Lunges with left rotation 1 x 10 Stationary Lunge 1 x 10  Stationary Lunge holding #10 KB 1 x 10  OMEGA Leg Press #45 on RLE  3 x 10  SLS on RLE 5 x 10 sec in corner of room  FOTO: 63  Knee A/ROM: -Knee Flex R 99/99 -Knee Ext  R 7/7    03/30/23: Nu-Step with seat a 10 for 5 min and resistance 3  OMEGA Leg Press BLE #45 1 x 10  OMEGA Leg Press BLE #75 1 x 10  OMEGA Leg Press RLE #35 1 x 10  OMEGA Leg Press RLE #45 2 x 10  TRX Squat with 1 x 10  TRX Squat with 1 x 10  -mod VC to increase hip flexion  -visual input with mirror  Partial Lunge with 1 UE 3 X 10  -min VC to decrease ankle  external rotation  Sled Push and Pull 10 M x 6  Squat with Hip    PATIENT EDUCATION:  Education details: Form and technique for correct performance of exercise  Person educated: Patient Education method: Explanation, Demonstration, Verbal cues, and Handouts Education comprehension: verbalized understanding, returned demonstration, and verbal cues required  HOME EXERCISE PROGRAM: Access Code: [email or web address] URL: [email or web address] Date: 04/06/2023 Prepared by: Ellin Goodie  Exercises - Prone Knee Extension with Ankle Weight  - 1 x daily - 2-3 reps - 5 min  hold - Seated Hamstring Stretch with Chair  - 1 x daily - 3 reps - 5-10 minutes  hold - Long Sitting Calf Stretch with Strap  - 2-3 x daily - 3 reps - 30-60 sec sec  hold - Gastroc Stretch on Step  - 2-3 x weekly - 3 reps - 60 sec  hold - Standing Partial Lunge  - 3-4 x weekly - 3 sets - 10 reps - Single Leg Stance  - 1 x daily - 5 reps - 10 sec  hold - Forward Step Down with  Heel Tap and Counter Support  - 3-4 x weekly - 3 sets - 10 reps - Standing Heel Raise  - 3-4 x weekly - 3 sets - 10 reps  Patient Education - Scar Massage  ASSESSMENT:  CLINICAL IMPRESSION: Pt is s/p 7 weeks for right TKA. He demonstrates improved right knee mobility and strength with ability to perform squats to >90 knee bend with arm support. He also achieved 85% of his predicted 1RM and performed forward and reverse lunges with added weight and mod cuing to reduce compensations of smaller steps with flat feet and increased lumbar flexion. He will continue to benefit from skilled PT to improve right knee ROM and strength to return to recreational exercise specifically cardio without pain or discomfort.    OBJECTIVE IMPAIRMENTS: Abnormal gait, decreased balance, difficulty walking, hypomobility, increased edema, impaired flexibility, and pain.   ACTIVITY LIMITATIONS: carrying, lifting, standing, squatting, stairs, bathing, dressing, and  locomotion level  PARTICIPATION LIMITATIONS: cleaning, laundry, driving, occupation, and yard work  PERSONAL FACTORS: Age are also affecting patient's functional outcome.   REHAB POTENTIAL: Excellent  CLINICAL DECISION MAKING: Stable/uncomplicated  EVALUATION COMPLEXITY: Low   GOALS: Goals reviewed with patient? No  SHORT TERM GOALS: Target date: 03/30/2023  PT reviewed the following HEP with patient with patient able to demonstrate a set of the following with min cuing for correction needed. PT educated patient on parameters of therex (how/when to inc/decrease intensity, frequency, rep/set range, stretch hold time, and purpose of therex) with verbalized understanding.  Baseline: NT 03/20/23: Able to perform independently  Goal status: ACHIEVED    LONG TERM GOALS: Target date: 05/25/2023  Patient will have improved function and activity level as evidenced by an increase in FOTO score by 10 points or more.  Baseline: 59 with target of 69 04/04/23: 63 Goal status: ONGOING   2.  Patient will demonstrate symmetrical hip and knee strength between RLE and LLE as evidence of improved right knee function.  Baseline: Knee Flex R/L 4-/4, Knee Ext R/L 4-/4  04/04/23: Knee Flex R 4+, Knee Flex R 4+ Goal status: ACHIEVED   3.  Patient will demonstrate symmetrical knee AROM between RLE and LLE as evidence of improved knee function. Baseline: Knee Flex R/L 70/120, Knee Ext R/L 5/2 04/04/23: Knee Flex R 99/ Ext R 7  Goal status: PARTIALLY MET    4.  Patient will demonstrate symmetrical knee girth between RLE and LLE for improved right knee function and evidence of healing.  Baseline: Knee Circumference R/L 18"/15" Goal status: ONGOING     PLAN:  PT FREQUENCY: 1-2x/week  PT DURATION: 10 weeks  PLANNED INTERVENTIONS: 97146- PT Re-evaluation, 97110-Therapeutic exercises, 97140- Manual therapy, 97116- Gait training, 40981- Aquatic Therapy, Patient/Family education, Balance training, Dry  Needling, Joint mobilization, Joint manipulation, Scar mobilization, DME instructions, Cryotherapy, Moist heat, Therapeutic exercises, Therapeutic activity, Neuromuscular re-education, Gait training, and Self Care  PLAN FOR NEXT SESSION: Single Leg Stance on foam, calf strengthening in non-weight bearing positions.  Arcelia Jew, Student-PT [employer] DPT Class of 2025

## 2023-04-20 ENCOUNTER — Ambulatory Visit: Payer: PPO | Admitting: Physical Therapy

## 2023-04-20 DIAGNOSIS — M25561 Pain in right knee: Secondary | ICD-10-CM | POA: Diagnosis not present

## 2023-04-20 DIAGNOSIS — R262 Difficulty in walking, not elsewhere classified: Secondary | ICD-10-CM

## 2023-04-20 NOTE — Therapy (Signed)
OUTPATIENT PHYSICAL THERAPY LOWER EXTREMITY TREATMENT    Patient Name: Darrell Freeman MRN: [id number] DOB:1955/11/20, 67 y.o., male Today's Date: 04/20/2023  END OF SESSION:  PT End of Session - 04/20/23 1817     Visit Number 9    Number of Visits 20    Date for PT Re-Evaluation 05/24/23    Authorization Type HTA 2024    Authorization - Visit Number 9    Authorization - Number of Visits 20    Progress Note Due on Visit 10    PT Start Time 1430    PT Stop Time 1520    PT Time Calculation (min) 50 min    Activity Tolerance Patient tolerated treatment well    Behavior During Therapy WFL for tasks assessed/performed                Past Medical History:  Diagnosis Date   Anxiety    Arthritis    Diverticulosis    GERD (gastroesophageal reflux disease)    GI bleed    Hypertension    Osteoarthritis of right knee    Past Surgical History:  Procedure Laterality Date   COLONOSCOPY WITH PROPOFOL N/A 03/16/2017   Procedure: COLONOSCOPY WITH PROPOFOL;  Surgeon: Wyline Mood, MD;  Location: [facility] ENDOSCOPY;  Service: Gastroenterology;  Laterality: N/A;   ESOPHAGOGASTRODUODENOSCOPY (EGD) WITH PROPOFOL N/A 03/16/2017   Procedure: ESOPHAGOGASTRODUODENOSCOPY (EGD) WITH PROPOFOL;  Surgeon: Wyline Mood, MD;  Location: [facility] ENDOSCOPY;  Service: Gastroenterology;  Laterality: N/A;   OLECRANON BURSA EXCISION     OLECRANON BURSECTOMY Left 12/26/2014   Procedure: OLECRANON BURSA;  Surgeon: Myra Rude, MD;  Location: [facility] ORS;  Service: Orthopedics;  Laterality: Left;   TOTAL KNEE ARTHROPLASTY Right 02/28/2023   Procedure: RIGHT TOTAL KNEE ARTHROPLASTY;  Surgeon: Kathryne Hitch, MD;  Location: [facility] OR;  Service: Orthopedics;  Laterality: Right;   VISCERAL ANGIOGRAPHY N/A 10/17/2018   Procedure: VISCERAL ANGIOGRAPHY;  Surgeon: Annice Needy, MD;  Location: [facility] INVASIVE CV LAB;  Service: Cardiovascular;  Laterality: N/A;   Patient Active Problem List   Diagnosis Date Noted    Status post total right knee replacement 02/28/2023   GIB (gastrointestinal bleeding) 10/17/2018   GI bleed 03/15/2017   HTN (hypertension) 03/15/2017   GERD (gastroesophageal reflux disease) 03/15/2017    PCP: Dr. Clydie Braun   REFERRING PROVIDER: Dr. Doneen Poisson   REFERRING DIAG: Right Total Knee Arthroplasty   THERAPY DIAG:  Acute pain of right knee  Difficulty in walking, not elsewhere classified  Rationale for Evaluation and Treatment: Rehabilitation  ONSET DATE: 02/28/23  SUBJECTIVE:   SUBJECTIVE STATEMENT: Pt reports his gout flare is still bothering his ankle. Pain 4-5/10 in right knee starting last night.     PERTINENT HISTORY: Pt reports that he had a relatively smooth surgery and that he continues to perform exercises that were given to him by HHPT. His pain is well controlled and he is now 4 weeks post op.  PAIN:  Are you having pain? Yes: NPRS scale: 1-2/10 Pain location: Right Anterior Knee  Pain description: Achy and Dull  Aggravating factors: Flexing knee  Relieving factors: Iciing and elevation and pain medications   PRECAUTIONS: None  RED FLAGS: None   WEIGHT BEARING RESTRICTIONS: No  FALLS:  Has patient fallen in last 6 months? No  LIVING ENVIRONMENT: Lives with: lives with their family and lives with their spouse Lives in: House/apartment Stairs: Yes: External: 2 steps; none Has following equipment at home: Dan Humphreys - 2 wheeled  OCCUPATION: Lab Technologist    PLOF: Independent  PATIENT GOALS: Wants to get back to his normal self especially exercising regularly.   NEXT MD VISIT: Nov 5th or 6th   OBJECTIVE:  Note: Objective measures were completed at Evaluation unless otherwise noted.  VITALS: BP 118/74 HR 80 SpO2 100%  DIAGNOSTIC FINDINGS:  CLINICAL DATA:  Status post right knee replacement.   EXAM: PORTABLE RIGHT KNEE - 1-2 VIEW   COMPARISON:  None Available.   FINDINGS: Right knee arthroplasty in expected  alignment. Long tibial stem. No periprosthetic lucency or fracture. There has been patellar resurfacing. Recent postsurgical change includes air and edema in the soft tissues and joint space. Anterior skin staples in place.   IMPRESSION: Right knee arthroplasty without immediate postoperative complication.     Electronically Signed   By: Narda Rutherford M.D.   On: 02/28/2023 13:23  PATIENT SURVEYS:  FOTO 59 with target of 69   COGNITION: Overall cognitive status: Within functional limits for tasks assessed     SENSATION: WFL  EDEMA:  Circumferential:  R 18"/ L 15"  MUSCLE LENGTH: Hamstrings: Right 70 deg; Left 70 deg Thomas test: Not tested   POSTURE: No Significant postural limitations  PALPATION: Right anterior knee   LOWER EXTREMITY ROM:  Active/ Passive ROM Right eval Left eval  Hip flexion    Hip extension    Hip abduction    Hip adduction    Hip internal rotation    Hip external rotation    Knee flexion 70/70 120/120  Knee extension 5/5 2/2  Ankle dorsiflexion    Ankle plantarflexion    Ankle inversion    Ankle eversion     (Blank rows = not tested)  LOWER EXTREMITY MMT:  MMT Right eval Left eval  Hip flexion    Hip extension    Hip abduction    Hip adduction    Hip internal rotation    Hip external rotation    Knee flexion 4- 4+  Knee extension 4- 4+  Ankle dorsiflexion    Ankle plantarflexion    Ankle inversion    Ankle eversion     (Blank rows = not tested)   FUNCTIONAL TESTS:  None performed   GAIT: Distance walked: 50 ft  Assistive device utilized: None Level of assistance: Modified independence Comments: 2WW with decreased step length    TODAY'S TREATMENT:                                                                                                                              DATE:   04/20/23 Matrix Recumbent Bike seat at 15 2.5 min forward and 2.5 backward  OMEGA Leg Press on RLE 2x8 @60 #, 1x8@55 #  - Predicted  1RM is 70# Squats to max depth with arms on stable bar for support 2x10   Sled Push 200 lbs forward and backward 10 m x 6   - HR 130 (predicted max 154) Split  squats 2x10 per side   04/18/23 Matrix Recumbent Bike seat at 15 2.5 min forward and 2.5 backward  OMEGA Leg Press on RLE #60  3 x 6  Sled Push 190 lbs forward and backward 10 m x 6  Forward Lunges @15 # 10 steps forward x 1  Backward Lunges @15 # 10 steps back ward x 1 Squats to max depth with arms on stable bar for support 2x10       04/11/23 Matrix Recumbent Bike seat at 15 2.5 min forward and 2.5 backward  Gait Analysis: decreased right terminal knee extension with no hip drop noted  OMEGA Leg Press on RLE #45  3 x 10  -min VC to increased knee flexion.  Forward Lunges 10 steps forward x 2  Backward Lunges 10 steps back ward x 2   Standing Hip Abduction with 1 UE Support 2 x 10 -min VC to decrease lateral lean.  Sled Push 170 lbs forward and backward 10 m x 6      04/06/23: Nu-Step with seat 9 with resistance at 4 for 5 min  Monster Walks with red band 10 M x 8 -30 step distance  OMEGA Leg Press #45 on RLE  3 x 10 Lateral Lunge into Squat  2 x 10 Scar Tissue Massage video and demonstration  Double Heel Raises with BUE support 1 x 10  -PT struggles to raise heels without forward lean  Standing Calf Stretch 2 x 30 sec    04/04/23: All single leg exercises performed on RLE  Nu-Step with seat a 10 for 5 min and resistance 3  Step Up onto 6 inch step on RLE 1 x 10  Step Up onto 6 inch step with #10 DB 1 x 10  Step Down from 6 inch step on RLE with BUE support 1 x 10  -mod VC to tap heel  Lunges with right rotations holding #10 KB 1 x 10  Lunges with left rotation 1 x 10 Stationary Lunge 1 x 10  Stationary Lunge holding #10 KB 1 x 10  OMEGA Leg Press #45 on RLE  3 x 10  SLS on RLE 5 x 10 sec in corner of room  FOTO: 63  Knee A/ROM: -Knee Flex R 99/99 -Knee Ext  R 7/7    03/30/23: Nu-Step with seat a 10 for  5 min and resistance 3  OMEGA Leg Press BLE #45 1 x 10  OMEGA Leg Press BLE #75 1 x 10  OMEGA Leg Press RLE #35 1 x 10  OMEGA Leg Press RLE #45 2 x 10  TRX Squat with 1 x 10  TRX Squat with 1 x 10  -mod VC to increase hip flexion  -visual input with mirror  Partial Lunge with 1 UE 3 X 10  -min VC to decrease ankle external rotation  Sled Push and Pull 10 M x 6  Squat with Hip    PATIENT EDUCATION:  Education details: Form and technique for correct performance of exercise  Person educated: Patient Education method: Explanation, Demonstration, Verbal cues, and Handouts Education comprehension: verbalized understanding, returned demonstration, and verbal cues required  HOME EXERCISE PROGRAM: Access Code: [email or web address] URL: [email or web address] Date: 04/20/2023 Prepared by: Ellin Goodie  Exercises - Prone Knee Extension with Ankle Weight  - 1 x daily - 2-3 reps - 5 min  hold - Seated Hamstring Stretch with Chair  - 1 x daily - 3 reps - 5-10 minutes  hold - Long Sitting Calf Stretch with  Strap  - 2-3 x daily - 3 reps - 30-60 sec sec  hold - Gastroc Stretch on Step  - 2-3 x weekly - 3 reps - 60 sec  hold - Side Stepping with Resistance at Ankles  - 3-4 x weekly - 3 sets - 20 reps - Single Leg Lunge with Foot on Bench  - 1 x daily - 3-4 x weekly - 4 sets - 10 reps - Single Leg Press  - 3 x weekly - 3 sets - 6-8 reps   Patient Education - Scar Massage  ASSESSMENT:  CLINICAL IMPRESSION: Pt is s/p 7 weeks for right TKA. He demonstrates improved right knee mobility and strength with ability to perform squats to >90 knee bend with arm support. He demonstrates increased right knee strength having achieved 85% of his predicted 1RM for leg press and performed forward and reverse lunges with added weight and mod cuing to reduce compensations of smaller steps with flat feet and increased lumbar flexion. He will continue to benefit from skilled PT to improve right knee ROM and  strength to return to recreational exercise specifically cardio without pain or discomfort.    OBJECTIVE IMPAIRMENTS: Abnormal gait, decreased balance, difficulty walking, hypomobility, increased edema, impaired flexibility, and pain.   ACTIVITY LIMITATIONS: carrying, lifting, standing, squatting, stairs, bathing, dressing, and locomotion level  PARTICIPATION LIMITATIONS: cleaning, laundry, driving, occupation, and yard work  PERSONAL FACTORS: Age are also affecting patient's functional outcome.   REHAB POTENTIAL: Excellent  CLINICAL DECISION MAKING: Stable/uncomplicated  EVALUATION COMPLEXITY: Low   GOALS: Goals reviewed with patient? No  SHORT TERM GOALS: Target date: 03/30/2023  PT reviewed the following HEP with patient with patient able to demonstrate a set of the following with min cuing for correction needed. PT educated patient on parameters of therex (how/when to inc/decrease intensity, frequency, rep/set range, stretch hold time, and purpose of therex) with verbalized understanding.  Baseline: NT 03/20/23: Able to perform independently  Goal status: ACHIEVED    LONG TERM GOALS: Target date: 05/25/2023  Patient will have improved function and activity level as evidenced by an increase in FOTO score by 10 points or more.  Baseline: 59 with target of 69 04/04/23: 63 Goal status: ONGOING   2.  Patient will demonstrate symmetrical hip and knee strength between RLE and LLE as evidence of improved right knee function.  Baseline: Knee Flex R/L 4-/4, Knee Ext R/L 4-/4  04/04/23: Knee Flex R 4+, Knee Flex R 4+ Goal status: ACHIEVED   3.  Patient will demonstrate symmetrical knee AROM between RLE and LLE as evidence of improved knee function. Baseline: Knee Flex R/L 70/120, Knee Ext R/L 5/2 04/04/23: Knee Flex R 99/ Ext R 7  Goal status: PARTIALLY MET    4.  Patient will demonstrate symmetrical knee girth between RLE and LLE for improved right knee function and evidence of  healing.  Baseline: Knee Circumference R/L 18"/15" Goal status: ONGOING     PLAN:  PT FREQUENCY: 1-2x/week  PT DURATION: 10 weeks  PLANNED INTERVENTIONS: 97146- PT Re-evaluation, 97110-Therapeutic exercises, 97140- Manual therapy, [id number]- Gait training, [id number]- Aquatic Therapy, Patient/Family education, Balance training, Dry Needling, Joint mobilization, Joint manipulation, Scar mobilization, DME instructions, Cryotherapy, Moist heat, Therapeutic exercises, Therapeutic activity, Neuromuscular re-education, Gait training, and Self Care  PLAN FOR NEXT SESSION: Reassess goals. Single Leg Stance on foam, calf strengthening in non-weight bearing positions. Program lateral lunges and side to side exercises   Arcelia Jew, Student-PT [employer] DPT Class of 2025

## 2023-04-24 ENCOUNTER — Ambulatory Visit: Payer: PPO | Admitting: Physical Therapy

## 2023-04-26 ENCOUNTER — Ambulatory Visit: Payer: PPO | Admitting: Physical Therapy

## 2023-04-27 ENCOUNTER — Other Ambulatory Visit: Payer: Self-pay | Admitting: Orthopaedic Surgery

## 2023-04-27 DIAGNOSIS — R2242 Localized swelling, mass and lump, left lower limb: Secondary | ICD-10-CM | POA: Diagnosis not present

## 2023-04-27 DIAGNOSIS — M1A9XX1 Chronic gout, unspecified, with tophus (tophi): Secondary | ICD-10-CM | POA: Diagnosis not present

## 2023-04-27 DIAGNOSIS — L987 Excessive and redundant skin and subcutaneous tissue: Secondary | ICD-10-CM | POA: Diagnosis not present

## 2023-04-27 DIAGNOSIS — M109 Gout, unspecified: Secondary | ICD-10-CM | POA: Diagnosis not present

## 2023-04-27 DIAGNOSIS — M7042 Prepatellar bursitis, left knee: Secondary | ICD-10-CM | POA: Diagnosis not present

## 2023-04-27 MED ORDER — HYDROCODONE-ACETAMINOPHEN 5-325 MG PO TABS
1.0000 | ORAL_TABLET | Freq: Four times a day (QID) | ORAL | 0 refills | Status: AC | PRN
Start: 2023-04-27 — End: ?

## 2023-05-02 ENCOUNTER — Telehealth: Payer: Self-pay | Admitting: Physical Therapy

## 2023-05-02 ENCOUNTER — Ambulatory Visit: Payer: PPO | Admitting: Physical Therapy

## 2023-05-02 NOTE — Telephone Encounter (Signed)
Called pt to inquire about absence from PT. Pt did not respond and VM not setup, so could not leave VM.

## 2023-05-08 ENCOUNTER — Ambulatory Visit: Payer: PPO | Admitting: Physical Therapy

## 2023-05-10 ENCOUNTER — Ambulatory Visit: Payer: PPO | Attending: Orthopaedic Surgery | Admitting: Physical Therapy

## 2023-05-11 ENCOUNTER — Ambulatory Visit: Payer: PPO | Admitting: Orthopaedic Surgery

## 2023-05-11 DIAGNOSIS — R2242 Localized swelling, mass and lump, left lower limb: Secondary | ICD-10-CM

## 2023-05-11 NOTE — Progress Notes (Signed)
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  We actually replaced his right knee just recently in September.  He reports that he is doing well from the right knee replacement as well as the recent outpatient surgery to remove the mass from his prepatellar area of the left knee.  This was consistent with gout.  He reports that his right knee is doing great.  His left operative knee is also doing well.  The left knee shows a well-healed incision.  The sutures were removed and we applied Steri-Strips.  Both knees have good range of motion.  From the standpoint of his right knee, we will see him back in 3 months and will have a standing AP and lateral of his right knee at that visit.  If he does have any issues with his left knee he knows to let us know or his right knee.

## 2023-05-17 DIAGNOSIS — M1A09X1 Idiopathic chronic gout, multiple sites, with tophus (tophi): Secondary | ICD-10-CM | POA: Diagnosis not present

## 2023-05-17 DIAGNOSIS — Z79899 Other long term (current) drug therapy: Secondary | ICD-10-CM | POA: Diagnosis not present

## 2023-06-27 ENCOUNTER — Ambulatory Visit: Payer: PPO | Admitting: Orthopedic Surgery

## 2023-06-27 ENCOUNTER — Other Ambulatory Visit: Payer: Self-pay

## 2023-06-27 DIAGNOSIS — M79642 Pain in left hand: Secondary | ICD-10-CM

## 2023-06-27 DIAGNOSIS — G5602 Carpal tunnel syndrome, left upper limb: Secondary | ICD-10-CM | POA: Diagnosis not present

## 2023-06-27 NOTE — Progress Notes (Signed)
[name] - 68 y.o. male MRN [id number]  Date of birth: 27-Feb-1956  Office Visit Note: Visit Date: 06/27/2023 PCP: Mick Sell, MD Referred by: Mick Sell, MD  Subjective: No chief complaint on file.  HPI: Darrell Freeman is a pleasant 68 y.o. male who presents today for establishment of care for ongoing left-sided carpal tunnel syndrome.  He has been seen previously at an outside facility for ongoing left-sided carpal tunnel syndrome last year, had undergone ongoing wrist bracing, ultrasound and cortisone injection to the left carpal tunnel region by his previous provider.  His symptoms have remained persistent despite these previous interventions.  He denies any radicular symptoms stemming from the neck region.  His numbness and tingling is isolated to the radial sided digits, thumb index and long finger of the left hand.  He is having nocturnal symptoms on a regular basis as well.  Pertinent ROS were reviewed with the patient and found to be negative unless otherwise specified above in HPI.   Visit Reason: left hand/wrist Duration of symptoms: years Hand dominance: right Occupation: retired, works part-time as a Designer, industrial/product Diabetic: No Smoking: No Heart/Lung History: none Blood Thinners: none  Prior Testing/EMG:none Injections (Date): Yes, 2024 to the carpal tunnel, ultrasound-guided Treatments: Wrist bracing Prior Surgery: none  Assessment & Plan: Visit Diagnoses:  1. Pain in left hand     Plan: Extensive discussion was had with the patient today regarding his ongoing left-sided carpal tunnel syndrome.  Based upon his previous workup and clinical examination today, his CTS 6 criteria is appropriate for diagnosis of likely carpal tunnel syndrome that is refractory to conservative care.  Given his symptoms refractory to conservative care in the form of bracing and prior injection as well as activity modification, I feel that we can forego any  electrodiagnostic study at this time given his CTS 6 evaluation.  He is an appropriate candidate for left open versus endoscopic carpal tunnel release.  We discussed both treatment modalities from a surgical standpoint, techniques, forms of anesthesia as well as risk and benefits and the standard postoperative protocol.  Understanding the above, he would like to proceed with left open carpal tunnel release under local anesthesia at the next available date.  Risks and benefits of the procedure were discussed, risks including but not limited to infection, bleeding, scarring, stiffness, nerve injury, tendon injury, vascular injury, recurrence of symptoms and need for subsequent operation.  Patient expressed understanding.      Follow-up: No follow-ups on file.   Meds & Orders: No orders of the defined types were placed in this encounter.   Orders Placed This Encounter  Procedures   XR Wrist Complete Left     Procedures: No procedures performed      Clinical History: No specialty comments available.  He reports that he quit smoking about 38 years ago. His smoking use included cigarettes. He started smoking about 45 years ago. He has never used smokeless tobacco. No results for input(s): "HGBA1C", "LABURIC" in the last 8760 hours.  Objective:   Vital Signs: There were no vitals taken for this visit.  Physical Exam  Gen: Well-appearing, in no acute distress; non-toxic CV: Regular Rate. Well-perfused. Warm.  Resp: Breathing unlabored on room air; no wheezing. Psych: Fluid speech in conversation; appropriate affect; normal thought process  Ortho Exam PHYSICAL EXAM:  General: Patient is well appearing and in no distress. Cervical spine mobility is full in all directions.  No evidence of radicular symptoms,  negative Spurling left.  Skin and Muscle: No significant skin changes are apparent to upper extremities.   Range of Motion and Palpation Tests: Mobility is full about the elbows  with flexion and extension.  Forearm supination and pronation are 85/85 bilaterally.  Wrist flexion/extension is 75/65 bilaterally.  Digital flexion and extension are full.  Thumb opposition is full to the base of the small fingers bilaterally.    No cords or nodules are palpated.  No triggering is observed.    Neurologic, Vascular, Motor: Left side Sensation is diminished to light touch in the left median nerve distribution.    Thenar atrophy: Negative Tinel sign: Positive carpal tunnel Carpal tunnel compression: Positive Phalen test: Positive  Sensory left hand 2-point discrimination (thumb, index, middle): 7-8 mm  Motor left hand FPL: 5/5 Index FDP: 5/5 APB: 5/5  Fingers pink and well perfused.  Capillary refill is brisk.     No results found for: "HGBA1C"   Imaging: No results found.  Past Medical/Family/Surgical/Social History: Medications & Allergies reviewed per EMR, new medications updated. Patient Active Problem List   Diagnosis Date Noted   Status post total right knee replacement 02/28/2023   GIB (gastrointestinal bleeding) 10/17/2018   GI bleed 03/15/2017   HTN (hypertension) 03/15/2017   GERD (gastroesophageal reflux disease) 03/15/2017   Past Medical History:  Diagnosis Date   Anxiety    Arthritis    Diverticulosis    GERD (gastroesophageal reflux disease)    GI bleed    Hypertension    Osteoarthritis of right knee    No family history on file. Past Surgical History:  Procedure Laterality Date   COLONOSCOPY WITH PROPOFOL N/A 03/16/2017   Procedure: COLONOSCOPY WITH PROPOFOL;  Surgeon: Wyline Mood, MD;  Location: [facility] ENDOSCOPY;  Service: Gastroenterology;  Laterality: N/A;   ESOPHAGOGASTRODUODENOSCOPY (EGD) WITH PROPOFOL N/A 03/16/2017   Procedure: ESOPHAGOGASTRODUODENOSCOPY (EGD) WITH PROPOFOL;  Surgeon: Wyline Mood, MD;  Location[facility] ENDOSCOPY;  Service: Gastroenterology;  Laterality: N/A;   OLECRANON BURSA EXCISION     OLECRANON BURSECTOMY  Left 12/26/2014   Procedure: OLECRANON BURSA;  Surgeon: Myra Rude, MD;  Location: [facility] ORS;  Service: Orthopedics;  Laterality: Left;   TOTAL KNEE ARTHROPLASTY Right 02/28/2023   Procedure: RIGHT TOTAL KNEE ARTHROPLASTY;  Surgeon: Kathryne Hitch, MD;  Location: [facility] OR;  Service: Orthopedics;  Laterality: Right;   VISCERAL ANGIOGRAPHY N/A 10/17/2018   Procedure: VISCERAL ANGIOGRAPHY;  Surgeon: Annice Needy, MD;  Location: [facility] INVASIVE CV LAB;  Service: Cardiovascular;  Laterality: N/A;   Social History   Occupational History   Not on file  Tobacco Use   Smoking status: Former    Current packs/day: 0.00    Types: Cigarettes    Start date: 12/11/1977    Quit date: 12/11/1984    Years since quitting: 38.5   Smokeless tobacco: Never  Vaping Use   Vaping status: Never Used  Substance and Sexual Activity   Alcohol use: Not Currently   Drug use: No   Sexual activity: Not on file    Darwin Guastella Fara Boros) Denese Killings, M.D.  [facility]

## 2023-07-17 DIAGNOSIS — Z0000 Encounter for general adult medical examination without abnormal findings: Secondary | ICD-10-CM | POA: Diagnosis not present

## 2023-07-17 DIAGNOSIS — R972 Elevated prostate specific antigen [PSA]: Secondary | ICD-10-CM | POA: Diagnosis not present

## 2023-07-17 DIAGNOSIS — Z125 Encounter for screening for malignant neoplasm of prostate: Secondary | ICD-10-CM | POA: Diagnosis not present

## 2023-07-17 DIAGNOSIS — M1000 Idiopathic gout, unspecified site: Secondary | ICD-10-CM | POA: Diagnosis not present

## 2023-07-17 DIAGNOSIS — D869 Sarcoidosis, unspecified: Secondary | ICD-10-CM | POA: Diagnosis not present

## 2023-07-17 DIAGNOSIS — M170 Bilateral primary osteoarthritis of knee: Secondary | ICD-10-CM | POA: Diagnosis not present

## 2023-07-17 DIAGNOSIS — I10 Essential (primary) hypertension: Secondary | ICD-10-CM | POA: Diagnosis not present

## 2023-07-17 DIAGNOSIS — R7303 Prediabetes: Secondary | ICD-10-CM | POA: Diagnosis not present

## 2023-07-17 DIAGNOSIS — N1831 Chronic kidney disease, stage 3a: Secondary | ICD-10-CM | POA: Diagnosis not present

## 2023-07-27 ENCOUNTER — Other Ambulatory Visit: Payer: Self-pay | Admitting: Orthopedic Surgery

## 2023-07-27 DIAGNOSIS — G5602 Carpal tunnel syndrome, left upper limb: Secondary | ICD-10-CM | POA: Diagnosis not present

## 2023-07-27 MED ORDER — ACETAMINOPHEN-CODEINE 300-30 MG PO TABS: 1.0000 | ORAL_TABLET | Freq: Four times a day (QID) | ORAL | 0 refills | Status: DC | PRN

## 2023-08-07 ENCOUNTER — Encounter: Payer: Self-pay | Admitting: Orthopaedic Surgery

## 2023-08-07 ENCOUNTER — Ambulatory Visit: Payer: PPO | Admitting: Orthopaedic Surgery

## 2023-08-07 ENCOUNTER — Other Ambulatory Visit (INDEPENDENT_AMBULATORY_CARE_PROVIDER_SITE_OTHER): Payer: Self-pay

## 2023-08-07 DIAGNOSIS — Z96651 Presence of right artificial knee joint: Secondary | ICD-10-CM

## 2023-08-07 NOTE — Progress Notes (Signed)
 The patient is getting close to 6 months out from a right total knee arthroplasty to treat significant right knee arthritis.  Since his knee replacement we did remove a large gout tophi mass from the prepatellar area of his left knee.  That was done in December.  He is doing well overall with both his knees.  He has had recent carpal tunnel surgery by Dr. Fara Boros.  Examination of his right operative knee from the knee replacement shows excellent range of motion with only some slight warmth and slight swelling.  The knee feels ligamentously stable.  He has no complaints of that knee.  His left knee incision from where the gout mass was removed looks good.  Standing AP and lateral the right knee shows a well-seated total knee arthroplasty.  There is more hardware in the proximal tibia from a large cystic area that was present from gout.  Overall the replacement looks good.  He will continue to increase his activities as comfort allows.  From my standpoint of his right knee, we will see him back in 6 months for final AP and lateral of his right knee.  That will be a year out from his initial surgery on the right knee.  If there are issues before then he knows to reach out to [place] and let us know.

## 2023-08-10 ENCOUNTER — Other Ambulatory Visit: Payer: Self-pay

## 2023-08-10 ENCOUNTER — Ambulatory Visit: Payer: PPO | Admitting: Orthopedic Surgery

## 2023-08-10 DIAGNOSIS — Z9889 Other specified postprocedural states: Secondary | ICD-10-CM

## 2023-08-10 NOTE — Progress Notes (Signed)
   Darrell Freeman - 68 y.o. male MRN [id number]  Date of birth: 01/14/1956  Office Visit Note: Visit Date: 08/10/2023 PCP: Mick Sell, MD Referred by: Mick Sell, MD  Subjective:  HPI: Darrell JAGIELLO is a 68 y.o. male who presents today for follow up 2 weeks status post left open carpal tunnel release.  He is doing well postoperatively, noticed immediate improvement in nocturnal symptoms, feels that the numbness and tingling is also improving during the day.  Pain is well-controlled.  He does have some baseline stiffness in the ring finger with a history of prior trigger digit, no longer triggers but does remain somewhat stiff with flexion.  Pertinent ROS were reviewed with the patient and found to be negative unless otherwise specified above in HPI.   Assessment & Plan: Visit Diagnoses: No diagnosis found.  Plan: He has done quite well from the carpal tunnel release postoperatively.  Sutures removed today and Steri-Strips applied.  Referral will be placed occupational therapy for exercises with transition to home exercise program as deemed appropriate.  Of also asked that they incorporate exercises for the ring finger for tendon glides.  Follow-up: No follow-ups on file.   Meds & Orders: No orders of the defined types were placed in this encounter.  No orders of the defined types were placed in this encounter.    Procedures: No procedures performed       Objective:   Vital Signs: There were no vitals taken for this visit.  Ortho Exam Left hand: - Well-healing palmar incision, sutures removed, skin edges well-approximated without erythema or drainage - Ring finger with underlying stiffness with active flexion, improved passively to composite fist, no active triggering - Sensation to light touch intact in the median nerve distribution - 5/5 APB without thenar atrophy   Imaging: No results found.   Catheleen Langhorne Trevor Mace, M.D. [facility],  Hand Surgery

## 2023-08-24 ENCOUNTER — Ambulatory Visit: Payer: PPO | Admitting: Urology

## 2023-08-29 NOTE — Therapy (Signed)
  OUTPATIENT OCCUPATIONAL THERAPY ORTHO EVALUATION  Patient Name: Darrell Freeman MRN: [id number] DOB:02/28/1956, 67 y.o., male Today's Date: 08/29/2023  PCP: Jean Rosenthal MD REFERRING PROVIDER: Samuella Cota, MD   END OF SESSION:   Past Medical History:  Diagnosis Date   Anxiety    Arthritis    Diverticulosis    GERD (gastroesophageal reflux disease)    GI bleed    Hypertension    Osteoarthritis of right knee    Past Surgical History:  Procedure Laterality Date   COLONOSCOPY WITH PROPOFOL N/A 03/16/2017   Procedure: COLONOSCOPY WITH PROPOFOL;  Surgeon: Wyline Mood, MD;  Location: [facility] ENDOSCOPY;  Service: Gastroenterology;  Laterality: N/A;   ESOPHAGOGASTRODUODENOSCOPY (EGD) WITH PROPOFOL N/A 03/16/2017   Procedure: ESOPHAGOGASTRODUODENOSCOPY (EGD) WITH PROPOFOL;  Surgeon: Wyline Mood, MD;  Location: [facility] ENDOSCOPY;  Service: Gastroenterology;  Laterality: N/A;   OLECRANON BURSA EXCISION     OLECRANON BURSECTOMY Left 12/26/2014   Procedure: OLECRANON BURSA;  Surgeon: Myra Rude, MD;  Location: [facility] ORS;  Service: Orthopedics;  Laterality: Left;   TOTAL KNEE ARTHROPLASTY Right 02/28/2023   Procedure: RIGHT TOTAL KNEE ARTHROPLASTY;  Surgeon: Kathryne Hitch, MD;  Location: [facility] OR;  Service: Orthopedics;  Laterality: Right;   VISCERAL ANGIOGRAPHY N/A 10/17/2018   Procedure: VISCERAL ANGIOGRAPHY;  Surgeon: Annice Needy, MD;  Location: [facility] INVASIVE CV LAB;  Service: Cardiovascular;  Laterality: N/A;   Patient Active Problem List   Diagnosis Date Noted   Status post total right knee replacement 02/28/2023   GIB (gastrointestinal bleeding) 10/17/2018   GI bleed 03/15/2017   HTN (hypertension) 03/15/2017   GERD (gastroesophageal reflux disease) 03/15/2017    ONSET DATE: DOS 07/27/23  REFERRING DIAG: W09.811 (ICD-10-CM) - S/P carpal tunnel release   THERAPY DIAG:  No diagnosis found.  Rationale for Evaluation and Treatment: Rehabilitation  SUBJECTIVE:    SUBJECTIVE STATEMENT: Now 5 weeks post Lt CTR. He states ***.    Pt accompanied by: {accompnied:27141}  PERTINENT HISTORY: ***  PRECAUTIONS: {Therapy precautions:24002}  RED FLAGS: {PT Red Flags:29287}   WEIGHT BEARING RESTRICTIONS: {Yes ***/No:24003}  PAIN:  Are you having pain? {OPRCPAIN:27236}  FALLS: Has patient fallen in last 6 months? {fallsyesno:27318}  LIVING ENVIRONMENT: Lives with: {OPRC lives with:25569::"lives with their family"} Lives in: {Lives in:25570} Stairs: {opstairs:27293} Has following equipment at home: {Assistive devices:23999}  PLOF: {PLOF:24004}  PATIENT GOALS: ***  NEXT MD VISIT: ***  *** 1 visit?   ASSESSMENT:  CLINICAL IMPRESSION: Patient is a 68 y.o. male who was seen today for occupational therapy evaluation for ***.   PERFORMANCE DEFICITS: in functional skills including {OT physical skills:25468}, cognitive skills including {OT cognitive skills:25469}, and psychosocial skills including {OT psychosocial skills:25470}.   IMPAIRMENTS: are limiting patient from {OT performance deficits:25471}.   COMORBIDITIES: {Comorbidities:25485} that affects occupational performance. Patient will benefit from skilled OT to address above impairments and improve overall function.  MODIFICATION OR ASSISTANCE TO COMPLETE EVALUATION: {OT modification:25474}  OT OCCUPATIONAL PROFILE AND HISTORY: {OT PROFILE AND HISTORY:25484}  CLINICAL DECISION MAKING: {OT CDM:25475}  REHAB POTENTIAL: {rehabpotential:25112}  EVALUATION COMPLEXITY: {Evaluation complexity:25115}      PLAN:  OT FREQUENCY: {rehab frequency:25116}  OT DURATION: {rehab duration:25117}  PLANNED INTERVENTIONS: {OT Interventions:25467}  RECOMMENDED OTHER SERVICES: ***  CONSULTED AND AGREED WITH PLAN OF CARE: {BJY:78295}  PLAN FOR NEXT SESSION: ***   Fannie Knee, OTR/L, CHT 08/29/2023, 12:23 PM

## 2023-08-31 ENCOUNTER — Ambulatory Visit: Admitting: Rehabilitative and Restorative Service Providers"

## 2023-08-31 ENCOUNTER — Encounter: Payer: Self-pay | Admitting: Rehabilitative and Restorative Service Providers"

## 2023-08-31 DIAGNOSIS — M25642 Stiffness of left hand, not elsewhere classified: Secondary | ICD-10-CM | POA: Diagnosis not present

## 2023-08-31 DIAGNOSIS — R202 Paresthesia of skin: Secondary | ICD-10-CM

## 2023-08-31 NOTE — Patient Instructions (Signed)
 Management of hand/arm numbness or "paresthesia"  Peripheral nerves do heal and regenerate- IF you are good to them and change habits/routines.   How you will be successful:  Change habits/routines, prevent "kinking the hose" and any numbness Exercise program to "unstick" nerves, loosen tightness in body Protect with supportive bracing as helpful  Avoid:  Repetitive or BAD prolonged postures Avoid "kinks" at neck, shoulder, elbow, forearm and wrist!! Generally, keep hands DOWN at night. Arm above head = bad Try to keep elbows at 90* or straighter  Don't bend or extend wrists extremely  Have pillow support for head/neck in neutral position  Watch resting with palm down and wrist flexed ALL THE TIME Things that hurt or cause numbness Direct pressure on "nerve points" For example: sleeping on hands, pressure while typing, resting wrist on steering wheel, resting elbow on center console, etc.   Specifics:  Change your sleep habits! Start every day with a non-painful stretch routine and gentle nerve "gliding" assigned by your therapist.  Stretch again, twice that day.   It's smart to gently warmup your body and stretch before doing typically stressful activities (or before bed if your hands hurt/go numb in the night).  If you must do repetitive or stressful activities, or can't break your sleep habits, try some type of support (wrist braces in night, etc.). Using an extra pillow and putting your hand in the pillowcase could help, too. Don't hold your phone to long! Using handles with padding, padded gloves can help with compression or vibration. Take breaks for rest and recovery; don't "force" yourself to finish a job.   Keep hands covered and warm in the winter or cool, rainy weather.

## 2023-10-02 DIAGNOSIS — M1A09X1 Idiopathic chronic gout, multiple sites, with tophus (tophi): Secondary | ICD-10-CM | POA: Diagnosis not present

## 2023-10-02 DIAGNOSIS — Z79899 Other long term (current) drug therapy: Secondary | ICD-10-CM | POA: Diagnosis not present

## 2023-10-03 ENCOUNTER — Ambulatory Visit: Admitting: Urology

## 2023-10-03 VITALS — BP 120/74 | HR 73 | Ht 66.0 in | Wt 175.1 lb

## 2023-10-03 DIAGNOSIS — R972 Elevated prostate specific antigen [PSA]: Secondary | ICD-10-CM | POA: Diagnosis not present

## 2023-10-04 NOTE — Progress Notes (Signed)
 I,Amy L Pierron,acting as a scribe for Dustin Gimenez, MD.,have documented all relevant documentation on the behalf of Dustin Gimenez, MD,as directed by  Dustin Gimenez, MD while in the presence of Dustin Gimenez, MD.  10/03/2023 6:05 AM   Carilyn Charles [birth year] [id number]  Referring provider: Delmus Ferri, MD [address] [place],  [place] [address]  Chief Complaint  Patient presents with   Establish Care   Elevated PSA    HPI: 68 year-old male with a personal history of gout, hypertension, sarcoidosis, and erectile dysfunction (on Cialis) presents today for further evaluation of elevated PSA levels.   His PSA was 5.09 on February 10th, and six months prior, it was 4.16. He has not had a repeat PSA since February.   His most recent cross-sectional imaging was in 2018 that showed bilateral kidney stones between four millimeters,  mildly distended bladder, and the prostate was a fairly normal size at that time.  He reports having annual PSA tests, but only two results are available in the current records. He mentions that previous tests were conducted at [facility], which may not have been integrated into the current system.   He denies any urinary symptoms such as nocturia or changes in urine stream.   He has a family history of breast cancer in his mother, who was diagnosed in her fifties, and a brother with rectal cancer.   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 He prefers to continue monitoring his PSA levels and is open to further evaluation if necessary.   PMH: Past Medical History:  Diagnosis Date   Anxiety    Arthritis    Diverticulosis    GERD (gastroesophageal reflux disease)    GI bleed    Hypertension    Osteoarthritis of right knee     Surgical History: Past Surgical History:  Procedure Laterality Date   COLONOSCOPY WITH PROPOFOL  N/A 03/16/2017   Procedure: COLONOSCOPY  WITH PROPOFOL ;  Surgeon: Luke Salaam, MD;  Location: [facility] ENDOSCOPY;  Service: Gastroenterology;  Laterality: N/A;   ESOPHAGOGASTRODUODENOSCOPY (EGD) WITH PROPOFOL  N/A 03/16/2017   Procedure: ESOPHAGOGASTRODUODENOSCOPY (EGD) WITH PROPOFOL ;  Surgeon: Luke Salaam, MD;  Location[facility] ENDOSCOPY;  Service: Gastroenterology;  Laterality: N/A;   OLECRANON BURSA EXCISION     OLECRANON BURSECTOMY Left 12/26/2014   Procedure: OLECRANON BURSA;  Surgeon: Daris Edman, MD;  Location: [facility] ORS;  Service: Orthopedics;  Laterality: Left;   TOTAL KNEE ARTHROPLASTY Right 02/28/2023   Procedure: RIGHT TOTAL KNEE ARTHROPLASTY;  Surgeon: Arnie Lao, MD;  Location: [facility] OR;  Service: Orthopedics;  Laterality: Right;   VISCERAL ANGIOGRAPHY N/A 10/17/2018   Procedure: VISCERAL ANGIOGRAPHY;  Surgeon: Celso College, MD;  Location: [facility] INVASIVE CV LAB;  Service: Cardiovascular;  Laterality: N/A;    Home Medications:  Allergies as of 10/03/2023   No Known Allergies      Medication List        Accurate as of October 03, 2023 11:59 PM. If you have any questions, ask your nurse or doctor.          STOP taking these medications    acetaminophen -codeine  300-30 MG tablet Commonly known as: TYLENOL  #3   aspirin  81 MG chewable tablet   gabapentin  100 MG capsule Commonly known as: NEURONTIN        TAKE these medications    allopurinol  100 MG tablet Commonly known as: ZYLOPRIM  Take 100 mg by mouth daily.   atorvastatin   10 MG tablet Commonly known as: LIPITOR Take 10 mg by mouth daily.   HYDROcodone -acetaminophen  5-325 MG tablet Commonly known as: NORCO/VICODIN Take 1-2 tablets by mouth every 6 (six) hours as needed for moderate pain (pain score 4-6).   indomethacin 25 MG capsule Commonly known as: INDOCIN Take 25 mg by mouth 2 (two) times daily as needed for moderate pain (gout flares).   losartan -hydrochlorothiazide  100-25 MG tablet Commonly known as: HYZAAR Take 1 tablet by mouth  daily.   pantoprazole  40 MG tablet Commonly known as: PROTONIX  Take 40 mg by mouth daily.   tadalafil 20 MG tablet Commonly known as: CIALIS Take 20 mg by mouth daily as needed for erectile dysfunction.   tiZANidine  4 MG tablet Commonly known as: Zanaflex  Take 1 tablet (4 mg total) by mouth every 6 (six) hours as needed for muscle spasms.        Social History:  reports that he quit smoking about 38 years ago. His smoking use included cigarettes. He started smoking about 45 years ago. He has never used smokeless tobacco. He reports that he does not currently use alcohol. He reports that he does not use drugs.   Physical Exam: BP 120/74   Pulse 73   Ht 5\' 6"  (1.676 m)   Wt 175 lb 2 oz (79.4 kg)   BMI 28.27 kg/m   Constitutional:  Alert and oriented, No acute distress. HEENT: [place] AT, moist mucus membranes.  Trachea midline, no masses. GU: 40 gram prostate. Firm prostate apex, symmetric. Rubbery lateral nodes.  Neurologic: Grossly intact, no focal deficits, moving all 4 extremities. Psychiatric: Normal mood and affect.   Assessment & Plan:    1. Elevated PSA  -  We reviewed the implications of an elevated PSA and the uncertainty surrounding it. In general, a man's PSA increases with age and is produced by both normal and cancerous prostate tissue. Differential for elevated PSA is BPH, prostate cancer, infection, recent intercourse/ejaculation, prostate infarction, recent urethroscopic manipulation (foley placement/cystoscopy) and prostatitis. Management of an elevated PSA can include observation or prostate biopsy and wediscussed this in detail.  - We discussed that indications for prostate biopsy are defined by age and race specific PSA cutoffs as well as a PSA velocity of 0.75/year. - Historical PSA levels from 2020 to 2022 were stable at approximately 2.6-2.7. The increase in PSA could be due to BPH, as indicated by the digital rectal exam findings.  - We discussed the option  of pursuing biopsy at this time, MRI versus continued surveillance.  He is retiring soon and would prefer to continue surveillance for the time being but if his PSA fails to decline or rises, he be amenable to biopsy.  - Continue to monitor PSA levels, with a repeat test scheduled for August, six months after the last test in February. Advised to avoid activities that may elevate PSA levels, such as ejaculation or riding a bike, 48 hours before the test.  2. Benign Prostatic Hyperplasia (BPH) - The digital rectal exam suggests BPH with a prostate size of approximately 40 grams. He is asymptomatic with no urinary issues. Continued monitoring of PSA levels is recommended, and if PSA levels continue to rise, further evaluation with MRI or biopsy may be considered.  Return in about 4 months (around 02/02/2024) for PSA.  I have reviewed the above documentation for accuracy and completeness, and I agree with the above.   Dustin Gimenez, MD   [facility] [phone]  [place], [place] [address] [phone]

## 2024-01-03 ENCOUNTER — Other Ambulatory Visit

## 2024-01-03 DIAGNOSIS — R972 Elevated prostate specific antigen [PSA]: Secondary | ICD-10-CM | POA: Diagnosis not present

## 2024-01-04 LAB — PSA: Prostate Specific Ag, Serum: 4.6 ng/mL — ABNORMAL HIGH (ref 0.0–4.0)

## 2024-01-05 ENCOUNTER — Telehealth: Payer: Self-pay | Admitting: Orthopedic Surgery

## 2024-01-05 NOTE — Telephone Encounter (Signed)
 Office has tried multiple attempts to reach patient to inquire on follow s/p carpal tunnel release.  No answer and VM is not set up to leave message.  Mourad Cwikla, MD Hand Surgery, [place]

## 2024-01-08 ENCOUNTER — Other Ambulatory Visit

## 2024-01-11 ENCOUNTER — Encounter: Payer: Self-pay | Admitting: Physician Assistant

## 2024-01-11 ENCOUNTER — Ambulatory Visit: Admitting: Physician Assistant

## 2024-01-11 VITALS — BP 116/74 | HR 85 | Ht 66.0 in | Wt 176.0 lb

## 2024-01-11 DIAGNOSIS — R972 Elevated prostate specific antigen [PSA]: Secondary | ICD-10-CM | POA: Diagnosis not present

## 2024-01-11 NOTE — Progress Notes (Signed)
 01/11/2024 9:35 AM   Darina VEAR Nose 08/10/1955 [id number]  CC: Chief Complaint  Patient presents with   Follow-up   HPI: Darrell Freeman is a 68 y.o. male with PMH elevated PSA, BPH, nephrolithiasis, and ED on tadalafil who presents today for follow-up.   Today he reports no new voiding symptoms within the last 6 months.  He denies gross hematuria or dysuria.  No acute concerns today.  PSA history as follows: 01/03/2024: 4.6 07/17/2023: 5.09 02/13/2023: 4.16  PMH: Past Medical History:  Diagnosis Date   Anxiety    Arthritis    Diverticulosis    GERD (gastroesophageal reflux disease)    GI bleed    Hypertension    Osteoarthritis of right knee     Surgical History: Past Surgical History:  Procedure Laterality Date   COLONOSCOPY WITH PROPOFOL  N/A 03/16/2017   Procedure: COLONOSCOPY WITH PROPOFOL ;  Surgeon: Therisa Bi, MD;  Location: [facility] ENDOSCOPY;  Service: Gastroenterology;  Laterality: N/A;   ESOPHAGOGASTRODUODENOSCOPY (EGD) WITH PROPOFOL  N/A 03/16/2017   Procedure: ESOPHAGOGASTRODUODENOSCOPY (EGD) WITH PROPOFOL ;  Surgeon: Therisa Bi, MD;  Location: [facility] ENDOSCOPY;  Service: Gastroenterology;  Laterality: N/A;   OLECRANON BURSA EXCISION     OLECRANON BURSECTOMY Left 12/26/2014   Procedure: OLECRANON BURSA;  Surgeon: Lonni Sharps, MD;  Location: [facility] ORS;  Service: Orthopedics;  Laterality: Left;   TOTAL KNEE ARTHROPLASTY Right 02/28/2023   Procedure: RIGHT TOTAL KNEE ARTHROPLASTY;  Surgeon: Vernetta Lonni GRADE, MD;  Location: [facility] OR;  Service: Orthopedics;  Laterality: Right;   VISCERAL ANGIOGRAPHY N/A 10/17/2018   Procedure: VISCERAL ANGIOGRAPHY;  Surgeon: Marea Selinda RAMAN, MD;  Location: [facility] INVASIVE CV LAB;  Service: Cardiovascular;  Laterality: N/A;    Home Medications:  Allergies as of 01/11/2024   No Known Allergies      Medication List        Accurate as of January 11, 2024  9:35 AM. If you have any questions, ask your nurse or doctor.           allopurinol  100 MG tablet Commonly known as: ZYLOPRIM  Take 100 mg by mouth daily.   atorvastatin  10 MG tablet Commonly known as: LIPITOR Take 10 mg by mouth daily.   HYDROcodone -acetaminophen  5-325 MG tablet Commonly known as: NORCO/VICODIN Take 1-2 tablets by mouth every 6 (six) hours as needed for moderate pain (pain score 4-6).   indomethacin 25 MG capsule Commonly known as: INDOCIN Take 25 mg by mouth 2 (two) times daily as needed for moderate pain (gout flares).   losartan -hydrochlorothiazide  100-25 MG tablet Commonly known as: HYZAAR Take 1 tablet by mouth daily.   pantoprazole  40 MG tablet Commonly known as: PROTONIX  Take 40 mg by mouth daily.   tadalafil 20 MG tablet Commonly known as: CIALIS Take 20 mg by mouth daily as needed for erectile dysfunction.   tiZANidine  4 MG tablet Commonly known as: Zanaflex  Take 1 tablet (4 mg total) by mouth every 6 (six) hours as needed for muscle spasms.        Allergies:  No Known Allergies  Family History: No family history on file.  Social History:   reports that he quit smoking about 39 years ago. His smoking use included cigarettes. He started smoking about 46 years ago. He has never used smokeless tobacco. He reports that he does not currently use alcohol. He reports that he does not use drugs.  Physical Exam: There were no vitals taken for this visit.  Constitutional:  Alert and oriented, no acute distress,  nontoxic appearing HEENT: [place], AT Cardiovascular: No clubbing, cyanosis, or edema Respiratory: Normal respiratory effort, no increased work of breathing Skin: No rashes, bruises or suspicious lesions Neurologic: Grossly intact, no focal deficits, moving all 4 extremities Psychiatric: Normal mood and affect  Laboratory Data: Results for orders placed or performed in visit on 01/03/24  PSA   Collection Time: 01/03/24 10:57 AM  Result Value Ref Range   Prostate Specific Ag, Serum 4.6 (H) 0.0 - 4.0 ng/mL    Assessment & Plan:   1. Elevated PSA (Primary) PSA decreased over prior.  Will defer further imaging or biopsy at this time.  I recommended repeat PSA in 6 months and clinic follow-up in 1 year, or sooner if his PSA is rising again.  He is in agreement.  Return in about 6 months (around 07/13/2024) for Lab visit for PSA + 1 year IPSS/SHIM/PVR/PSA/DRE.  Lucie Hones, PA-C  [facility] [place] [address] [place], [place] [address] [phone]

## 2024-02-07 ENCOUNTER — Ambulatory Visit: Admitting: Orthopaedic Surgery

## 2024-02-08 ENCOUNTER — Ambulatory Visit (INDEPENDENT_AMBULATORY_CARE_PROVIDER_SITE_OTHER): Admitting: Orthopaedic Surgery

## 2024-02-08 ENCOUNTER — Encounter: Payer: Self-pay | Admitting: Orthopaedic Surgery

## 2024-02-08 ENCOUNTER — Other Ambulatory Visit (INDEPENDENT_AMBULATORY_CARE_PROVIDER_SITE_OTHER): Payer: Self-pay

## 2024-02-08 DIAGNOSIS — Z96651 Presence of right artificial knee joint: Secondary | ICD-10-CM | POA: Diagnosis not present

## 2024-02-08 NOTE — Progress Notes (Signed)
 The patient is a year out from a right total knee arthroplasty.  This was secondary to his significant gout as well as osteoarthritis.  He had large cystic changes in the proximal tibia and this were warranted a revision tibial component.  He says that knee is doing great.  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  He says both knees are doing great.  He does have known arthritis and gout in the left knee with cystic changes in the metaphyseal section of the proximal tibia on the left side but he said he is still asymptomatic.  My partner Dr. Erwin has performed carpal tunnel surgery on him as well.  He says overall he is doing great and he has no issues.  He has excellent range of motion of both knees that are pain-free.  Both incisions have healed nicely.  Both knees feel stable.  An AP and lateral standing today shows both knees standing on the AP view and on the lateral view of his right knee shows a well-seated right total knee arthroplasty with no complicating features.  The left knee again has significant arthritic changes and obvious cystic changes in the proximal tibia but right now he is asymptomatic.  He will follow-up as needed but he does know that if things worsen with his left knee do not hesitate to let us  know or if he develops issues again with his right knee.  All questions and concerns were addressed and answered.

## 2024-03-27 DIAGNOSIS — Z0000 Encounter for general adult medical examination without abnormal findings: Secondary | ICD-10-CM | POA: Diagnosis not present

## 2024-03-27 DIAGNOSIS — R7303 Prediabetes: Secondary | ICD-10-CM | POA: Diagnosis not present

## 2024-03-27 DIAGNOSIS — I10 Essential (primary) hypertension: Secondary | ICD-10-CM | POA: Diagnosis not present

## 2024-03-27 DIAGNOSIS — Z13220 Encounter for screening for lipoid disorders: Secondary | ICD-10-CM | POA: Diagnosis not present

## 2024-03-27 DIAGNOSIS — M1000 Idiopathic gout, unspecified site: Secondary | ICD-10-CM | POA: Diagnosis not present

## 2024-03-27 DIAGNOSIS — Z125 Encounter for screening for malignant neoplasm of prostate: Secondary | ICD-10-CM | POA: Diagnosis not present

## 2024-04-02 DIAGNOSIS — M1000 Idiopathic gout, unspecified site: Secondary | ICD-10-CM | POA: Diagnosis not present

## 2024-04-02 DIAGNOSIS — D869 Sarcoidosis, unspecified: Secondary | ICD-10-CM | POA: Diagnosis not present

## 2024-04-02 DIAGNOSIS — Z23 Encounter for immunization: Secondary | ICD-10-CM | POA: Diagnosis not present

## 2024-04-02 DIAGNOSIS — R972 Elevated prostate specific antigen [PSA]: Secondary | ICD-10-CM | POA: Diagnosis not present

## 2024-04-02 DIAGNOSIS — M170 Bilateral primary osteoarthritis of knee: Secondary | ICD-10-CM | POA: Diagnosis not present

## 2024-04-02 DIAGNOSIS — R7303 Prediabetes: Secondary | ICD-10-CM | POA: Diagnosis not present

## 2024-04-02 DIAGNOSIS — D649 Anemia, unspecified: Secondary | ICD-10-CM | POA: Diagnosis not present

## 2024-04-02 DIAGNOSIS — Z0000 Encounter for general adult medical examination without abnormal findings: Secondary | ICD-10-CM | POA: Diagnosis not present

## 2024-04-02 DIAGNOSIS — N1831 Chronic kidney disease, stage 3a: Secondary | ICD-10-CM | POA: Diagnosis not present

## 2024-04-02 DIAGNOSIS — I10 Essential (primary) hypertension: Secondary | ICD-10-CM | POA: Diagnosis not present

## 2024-04-02 DIAGNOSIS — Z1331 Encounter for screening for depression: Secondary | ICD-10-CM | POA: Diagnosis not present

## 2024-04-02 DIAGNOSIS — Z136 Encounter for screening for cardiovascular disorders: Secondary | ICD-10-CM | POA: Diagnosis not present

## 2024-04-03 ENCOUNTER — Other Ambulatory Visit: Payer: Self-pay | Admitting: Physician Assistant

## 2024-04-03 DIAGNOSIS — Z136 Encounter for screening for cardiovascular disorders: Secondary | ICD-10-CM

## 2024-04-08 ENCOUNTER — Encounter: Payer: Self-pay | Admitting: Radiology

## 2024-04-18 ENCOUNTER — Ambulatory Visit
Admission: RE | Admit: 2024-04-18 | Discharge: 2024-04-18 | Disposition: A | Discharge status: Home / Self Care | Source: Ambulatory Visit | Attending: Physician Assistant | Admitting: Physician Assistant

## 2024-04-18 DIAGNOSIS — Z136 Encounter for screening for cardiovascular disorders: Secondary | ICD-10-CM | POA: Diagnosis not present

## 2024-07-15 ENCOUNTER — Other Ambulatory Visit

## 2025-01-06 ENCOUNTER — Other Ambulatory Visit

## 2025-01-13 ENCOUNTER — Ambulatory Visit: Admitting: Physician Assistant
